# Patient Record
Sex: Male | Born: 1940 | Race: White | Hispanic: Yes | Smoking: Former smoker
Health system: Southern US, Community
[De-identification: ages and names within clinical notes are randomized; demographics above are authoritative.]

## PROBLEM LIST (undated history)

## (undated) DIAGNOSIS — E785 Hyperlipidemia, unspecified: Secondary | ICD-10-CM

## (undated) DIAGNOSIS — K2960 Other gastritis without bleeding: Secondary | ICD-10-CM

## (undated) DIAGNOSIS — E1122 Type 2 diabetes mellitus with diabetic chronic kidney disease: Secondary | ICD-10-CM

## (undated) DIAGNOSIS — E663 Overweight: Secondary | ICD-10-CM

## (undated) DIAGNOSIS — N183 Chronic kidney disease, stage 3 unspecified: Secondary | ICD-10-CM

## (undated) DIAGNOSIS — R0609 Other forms of dyspnea: Secondary | ICD-10-CM

## (undated) DIAGNOSIS — Z6827 Body mass index (BMI) 27.0-27.9, adult: Secondary | ICD-10-CM

## (undated) DIAGNOSIS — F3340 Major depressive disorder, recurrent, in remission, unspecified: Secondary | ICD-10-CM

## (undated) DIAGNOSIS — I110 Hypertensive heart disease with heart failure: Secondary | ICD-10-CM

## (undated) DIAGNOSIS — Z6720 Type B blood, Rh positive: Secondary | ICD-10-CM

## (undated) DIAGNOSIS — N1832 Chronic kidney disease, stage 3b: Secondary | ICD-10-CM

## (undated) DIAGNOSIS — M8580 Other specified disorders of bone density and structure, unspecified site: Secondary | ICD-10-CM

## (undated) DIAGNOSIS — E559 Vitamin D deficiency, unspecified: Secondary | ICD-10-CM

## (undated) DIAGNOSIS — I25118 Atherosclerotic heart disease of native coronary artery with other forms of angina pectoris: Secondary | ICD-10-CM

## (undated) DIAGNOSIS — E039 Hypothyroidism, unspecified: Secondary | ICD-10-CM

## (undated) HISTORY — DX: Other specified disorders of bone density and structure, unspecified site: M85.80

## (undated) HISTORY — DX: Atherosclerotic heart disease of native coronary artery with other forms of angina pectoris: I25.118

## (undated) HISTORY — DX: Overweight: E66.3

## (undated) HISTORY — DX: Hyperlipidemia, unspecified: E78.5

## (undated) HISTORY — DX: Body mass index (BMI) 27.0-27.9, adult: Z68.27

## (undated) HISTORY — DX: Chronic kidney disease, stage 3b: N18.32

## (undated) HISTORY — DX: Type 2 diabetes mellitus with diabetic chronic kidney disease: E11.22

## (undated) HISTORY — DX: Other forms of dyspnea: R06.09

## (undated) HISTORY — DX: Type B blood, Rh positive: Z67.20

## (undated) HISTORY — PX: TONSILLECTOMY: SUR1361

## (undated) HISTORY — DX: Vitamin D deficiency, unspecified: E55.9

## (undated) HISTORY — DX: Hypothyroidism, unspecified: E03.9

## (undated) HISTORY — DX: Hypertensive heart disease with heart failure: I11.0

## (undated) HISTORY — DX: Major depressive disorder, recurrent, in remission, unspecified: F33.40

## (undated) HISTORY — DX: Other gastritis without bleeding: K29.60

## (undated) HISTORY — DX: Chronic kidney disease, stage 3 unspecified: N18.30

---

## 1993-10-06 HISTORY — PX: CORONARY ARTERY BYPASS GRAFT: SHX141

## 2012-10-06 HISTORY — PX: CHOLECYSTECTOMY: SHX55

## 2016-01-05 HISTORY — PX: CATARACT EXTRACTION, BILATERAL: SHX1313

## 2017-11-10 ENCOUNTER — Other Ambulatory Visit (HOSPITAL_COMMUNITY): Payer: Self-pay | Admitting: Endocrinology

## 2017-11-10 DIAGNOSIS — E0590 Thyrotoxicosis, unspecified without thyrotoxic crisis or storm: Secondary | ICD-10-CM

## 2017-11-19 ENCOUNTER — Encounter (HOSPITAL_COMMUNITY)
Admission: RE | Admit: 2017-11-19 | Discharge: 2017-11-19 | Disposition: A | Discharge status: Home / Self Care | Payer: Medicare Other | Source: Ambulatory Visit | Attending: Endocrinology | Admitting: Endocrinology

## 2017-11-19 DIAGNOSIS — E0590 Thyrotoxicosis, unspecified without thyrotoxic crisis or storm: Secondary | ICD-10-CM | POA: Diagnosis present

## 2017-11-20 ENCOUNTER — Encounter (HOSPITAL_COMMUNITY)
Admission: RE | Admit: 2017-11-20 | Discharge: 2017-11-20 | Disposition: A | Discharge status: Home / Self Care | Payer: Medicare Other | Source: Ambulatory Visit | Attending: Endocrinology | Admitting: Endocrinology

## 2017-11-20 MED ORDER — SODIUM PERTECHNETATE TC 99M INJECTION
10.0000 | Freq: Once | INTRAVENOUS | Status: AC | PRN
  Administered 2017-11-20: 10 via INTRAVENOUS

## 2017-12-10 ENCOUNTER — Other Ambulatory Visit (HOSPITAL_COMMUNITY): Payer: Self-pay | Admitting: Endocrinology

## 2017-12-10 DIAGNOSIS — E0590 Thyrotoxicosis, unspecified without thyrotoxic crisis or storm: Secondary | ICD-10-CM

## 2017-12-17 ENCOUNTER — Encounter (HOSPITAL_COMMUNITY)
Admission: RE | Admit: 2017-12-17 | Discharge: 2017-12-17 | Disposition: A | Discharge status: Home / Self Care | Payer: Medicare Other | Source: Ambulatory Visit | Attending: Endocrinology | Admitting: Endocrinology

## 2017-12-17 DIAGNOSIS — E0590 Thyrotoxicosis, unspecified without thyrotoxic crisis or storm: Secondary | ICD-10-CM | POA: Diagnosis present

## 2017-12-17 MED ORDER — SODIUM IODIDE I 131 CAPSULE
21.5000 | Freq: Once | INTRAVENOUS | Status: AC | PRN
  Administered 2017-12-17: 21.5 via ORAL

## 2019-10-10 DIAGNOSIS — I110 Hypertensive heart disease with heart failure: Secondary | ICD-10-CM | POA: Diagnosis not present

## 2019-10-10 DIAGNOSIS — N183 Chronic kidney disease, stage 3 unspecified: Secondary | ICD-10-CM | POA: Diagnosis not present

## 2019-10-10 DIAGNOSIS — I2510 Atherosclerotic heart disease of native coronary artery without angina pectoris: Secondary | ICD-10-CM | POA: Diagnosis not present

## 2019-10-10 DIAGNOSIS — E039 Hypothyroidism, unspecified: Secondary | ICD-10-CM | POA: Diagnosis not present

## 2019-10-10 DIAGNOSIS — Z0000 Encounter for general adult medical examination without abnormal findings: Secondary | ICD-10-CM | POA: Diagnosis not present

## 2019-10-10 DIAGNOSIS — K2100 Gastro-esophageal reflux disease with esophagitis, without bleeding: Secondary | ICD-10-CM | POA: Diagnosis not present

## 2019-10-10 DIAGNOSIS — E785 Hyperlipidemia, unspecified: Secondary | ICD-10-CM | POA: Diagnosis not present

## 2019-10-10 DIAGNOSIS — E1122 Type 2 diabetes mellitus with diabetic chronic kidney disease: Secondary | ICD-10-CM | POA: Diagnosis not present

## 2019-10-10 DIAGNOSIS — Z79899 Other long term (current) drug therapy: Secondary | ICD-10-CM | POA: Diagnosis not present

## 2019-10-13 DIAGNOSIS — N189 Chronic kidney disease, unspecified: Secondary | ICD-10-CM | POA: Diagnosis not present

## 2019-10-13 DIAGNOSIS — E109 Type 1 diabetes mellitus without complications: Secondary | ICD-10-CM | POA: Diagnosis not present

## 2019-10-13 DIAGNOSIS — I10 Essential (primary) hypertension: Secondary | ICD-10-CM | POA: Diagnosis not present

## 2019-10-13 DIAGNOSIS — E890 Postprocedural hypothyroidism: Secondary | ICD-10-CM | POA: Diagnosis not present

## 2019-10-13 DIAGNOSIS — E7800 Pure hypercholesterolemia, unspecified: Secondary | ICD-10-CM | POA: Diagnosis not present

## 2019-10-13 DIAGNOSIS — R809 Proteinuria, unspecified: Secondary | ICD-10-CM | POA: Diagnosis not present

## 2019-11-10 DIAGNOSIS — H47233 Glaucomatous optic atrophy, bilateral: Secondary | ICD-10-CM | POA: Diagnosis not present

## 2019-11-10 DIAGNOSIS — H40013 Open angle with borderline findings, low risk, bilateral: Secondary | ICD-10-CM | POA: Diagnosis not present

## 2019-11-10 DIAGNOSIS — I10 Essential (primary) hypertension: Secondary | ICD-10-CM | POA: Diagnosis not present

## 2020-01-23 DIAGNOSIS — L570 Actinic keratosis: Secondary | ICD-10-CM | POA: Diagnosis not present

## 2020-01-23 DIAGNOSIS — R233 Spontaneous ecchymoses: Secondary | ICD-10-CM | POA: Diagnosis not present

## 2020-01-23 DIAGNOSIS — L578 Other skin changes due to chronic exposure to nonionizing radiation: Secondary | ICD-10-CM | POA: Diagnosis not present

## 2020-01-23 DIAGNOSIS — C44319 Basal cell carcinoma of skin of other parts of face: Secondary | ICD-10-CM | POA: Diagnosis not present

## 2020-01-25 DIAGNOSIS — C44329 Squamous cell carcinoma of skin of other parts of face: Secondary | ICD-10-CM | POA: Diagnosis not present

## 2020-04-04 DIAGNOSIS — N1831 Chronic kidney disease, stage 3a: Secondary | ICD-10-CM | POA: Diagnosis not present

## 2020-04-04 DIAGNOSIS — E785 Hyperlipidemia, unspecified: Secondary | ICD-10-CM | POA: Diagnosis not present

## 2020-04-04 DIAGNOSIS — N183 Chronic kidney disease, stage 3 unspecified: Secondary | ICD-10-CM | POA: Diagnosis not present

## 2020-04-04 DIAGNOSIS — Z0183 Encounter for blood typing: Secondary | ICD-10-CM | POA: Diagnosis not present

## 2020-04-04 DIAGNOSIS — E039 Hypothyroidism, unspecified: Secondary | ICD-10-CM | POA: Diagnosis not present

## 2020-04-04 DIAGNOSIS — F3340 Major depressive disorder, recurrent, in remission, unspecified: Secondary | ICD-10-CM | POA: Diagnosis not present

## 2020-04-04 DIAGNOSIS — E1122 Type 2 diabetes mellitus with diabetic chronic kidney disease: Secondary | ICD-10-CM | POA: Diagnosis not present

## 2020-04-04 DIAGNOSIS — Z79899 Other long term (current) drug therapy: Secondary | ICD-10-CM | POA: Diagnosis not present

## 2020-04-04 DIAGNOSIS — I110 Hypertensive heart disease with heart failure: Secondary | ICD-10-CM | POA: Diagnosis not present

## 2020-04-04 DIAGNOSIS — I2510 Atherosclerotic heart disease of native coronary artery without angina pectoris: Secondary | ICD-10-CM | POA: Diagnosis not present

## 2020-04-11 DIAGNOSIS — N189 Chronic kidney disease, unspecified: Secondary | ICD-10-CM | POA: Diagnosis not present

## 2020-04-11 DIAGNOSIS — E890 Postprocedural hypothyroidism: Secondary | ICD-10-CM | POA: Diagnosis not present

## 2020-04-11 DIAGNOSIS — R809 Proteinuria, unspecified: Secondary | ICD-10-CM | POA: Diagnosis not present

## 2020-04-11 DIAGNOSIS — E109 Type 1 diabetes mellitus without complications: Secondary | ICD-10-CM | POA: Diagnosis not present

## 2020-04-11 DIAGNOSIS — I10 Essential (primary) hypertension: Secondary | ICD-10-CM | POA: Diagnosis not present

## 2020-04-11 DIAGNOSIS — E7800 Pure hypercholesterolemia, unspecified: Secondary | ICD-10-CM | POA: Diagnosis not present

## 2020-05-09 DIAGNOSIS — H52223 Regular astigmatism, bilateral: Secondary | ICD-10-CM | POA: Diagnosis not present

## 2020-05-09 DIAGNOSIS — H524 Presbyopia: Secondary | ICD-10-CM | POA: Diagnosis not present

## 2020-05-09 DIAGNOSIS — I10 Essential (primary) hypertension: Secondary | ICD-10-CM | POA: Diagnosis not present

## 2020-05-09 DIAGNOSIS — H26492 Other secondary cataract, left eye: Secondary | ICD-10-CM | POA: Diagnosis not present

## 2020-05-09 DIAGNOSIS — H40013 Open angle with borderline findings, low risk, bilateral: Secondary | ICD-10-CM | POA: Diagnosis not present

## 2020-05-09 DIAGNOSIS — E109 Type 1 diabetes mellitus without complications: Secondary | ICD-10-CM | POA: Diagnosis not present

## 2020-05-09 DIAGNOSIS — H47233 Glaucomatous optic atrophy, bilateral: Secondary | ICD-10-CM | POA: Diagnosis not present

## 2020-05-09 DIAGNOSIS — Z961 Presence of intraocular lens: Secondary | ICD-10-CM | POA: Diagnosis not present

## 2020-05-09 DIAGNOSIS — Z9841 Cataract extraction status, right eye: Secondary | ICD-10-CM | POA: Diagnosis not present

## 2020-07-19 DIAGNOSIS — Z23 Encounter for immunization: Secondary | ICD-10-CM | POA: Diagnosis not present

## 2020-10-17 DIAGNOSIS — I25118 Atherosclerotic heart disease of native coronary artery with other forms of angina pectoris: Secondary | ICD-10-CM | POA: Diagnosis not present

## 2020-10-17 DIAGNOSIS — Z0000 Encounter for general adult medical examination without abnormal findings: Secondary | ICD-10-CM | POA: Diagnosis not present

## 2020-10-17 DIAGNOSIS — N183 Chronic kidney disease, stage 3 unspecified: Secondary | ICD-10-CM | POA: Diagnosis not present

## 2020-10-17 DIAGNOSIS — I110 Hypertensive heart disease with heart failure: Secondary | ICD-10-CM | POA: Diagnosis not present

## 2020-10-17 DIAGNOSIS — E039 Hypothyroidism, unspecified: Secondary | ICD-10-CM | POA: Diagnosis not present

## 2020-10-17 DIAGNOSIS — N1832 Chronic kidney disease, stage 3b: Secondary | ICD-10-CM | POA: Diagnosis not present

## 2020-10-17 DIAGNOSIS — Z125 Encounter for screening for malignant neoplasm of prostate: Secondary | ICD-10-CM | POA: Diagnosis not present

## 2020-10-17 DIAGNOSIS — E785 Hyperlipidemia, unspecified: Secondary | ICD-10-CM | POA: Diagnosis not present

## 2020-10-17 DIAGNOSIS — E1122 Type 2 diabetes mellitus with diabetic chronic kidney disease: Secondary | ICD-10-CM | POA: Diagnosis not present

## 2020-10-29 DIAGNOSIS — E7800 Pure hypercholesterolemia, unspecified: Secondary | ICD-10-CM | POA: Diagnosis not present

## 2020-10-29 DIAGNOSIS — E890 Postprocedural hypothyroidism: Secondary | ICD-10-CM | POA: Diagnosis not present

## 2020-10-29 DIAGNOSIS — R809 Proteinuria, unspecified: Secondary | ICD-10-CM | POA: Diagnosis not present

## 2020-10-29 DIAGNOSIS — N189 Chronic kidney disease, unspecified: Secondary | ICD-10-CM | POA: Diagnosis not present

## 2020-10-29 DIAGNOSIS — I10 Essential (primary) hypertension: Secondary | ICD-10-CM | POA: Diagnosis not present

## 2020-10-29 DIAGNOSIS — E109 Type 1 diabetes mellitus without complications: Secondary | ICD-10-CM | POA: Diagnosis not present

## 2020-11-23 DIAGNOSIS — H40013 Open angle with borderline findings, low risk, bilateral: Secondary | ICD-10-CM | POA: Diagnosis not present

## 2020-11-23 DIAGNOSIS — H47233 Glaucomatous optic atrophy, bilateral: Secondary | ICD-10-CM | POA: Diagnosis not present

## 2020-12-04 DIAGNOSIS — L570 Actinic keratosis: Secondary | ICD-10-CM | POA: Diagnosis not present

## 2020-12-04 DIAGNOSIS — C44319 Basal cell carcinoma of skin of other parts of face: Secondary | ICD-10-CM | POA: Diagnosis not present

## 2020-12-04 DIAGNOSIS — L814 Other melanin hyperpigmentation: Secondary | ICD-10-CM | POA: Diagnosis not present

## 2020-12-04 DIAGNOSIS — L821 Other seborrheic keratosis: Secondary | ICD-10-CM | POA: Diagnosis not present

## 2020-12-06 DIAGNOSIS — E1022 Type 1 diabetes mellitus with diabetic chronic kidney disease: Secondary | ICD-10-CM | POA: Diagnosis not present

## 2020-12-06 DIAGNOSIS — I129 Hypertensive chronic kidney disease with stage 1 through stage 4 chronic kidney disease, or unspecified chronic kidney disease: Secondary | ICD-10-CM | POA: Diagnosis not present

## 2020-12-06 DIAGNOSIS — N1832 Chronic kidney disease, stage 3b: Secondary | ICD-10-CM | POA: Diagnosis not present

## 2020-12-06 DIAGNOSIS — E785 Hyperlipidemia, unspecified: Secondary | ICD-10-CM | POA: Diagnosis not present

## 2020-12-06 DIAGNOSIS — N189 Chronic kidney disease, unspecified: Secondary | ICD-10-CM | POA: Diagnosis not present

## 2020-12-06 DIAGNOSIS — N2581 Secondary hyperparathyroidism of renal origin: Secondary | ICD-10-CM | POA: Diagnosis not present

## 2020-12-06 DIAGNOSIS — D631 Anemia in chronic kidney disease: Secondary | ICD-10-CM | POA: Diagnosis not present

## 2020-12-11 DIAGNOSIS — C44319 Basal cell carcinoma of skin of other parts of face: Secondary | ICD-10-CM | POA: Diagnosis not present

## 2020-12-17 ENCOUNTER — Other Ambulatory Visit: Payer: Self-pay | Admitting: Nephrology

## 2020-12-17 DIAGNOSIS — N1832 Chronic kidney disease, stage 3b: Secondary | ICD-10-CM

## 2020-12-20 DIAGNOSIS — N400 Enlarged prostate without lower urinary tract symptoms: Secondary | ICD-10-CM | POA: Diagnosis not present

## 2020-12-20 DIAGNOSIS — N1832 Chronic kidney disease, stage 3b: Secondary | ICD-10-CM | POA: Diagnosis not present

## 2020-12-20 DIAGNOSIS — N189 Chronic kidney disease, unspecified: Secondary | ICD-10-CM | POA: Diagnosis not present

## 2020-12-24 ENCOUNTER — Other Ambulatory Visit: Payer: Medicare Other

## 2020-12-24 DIAGNOSIS — I129 Hypertensive chronic kidney disease with stage 1 through stage 4 chronic kidney disease, or unspecified chronic kidney disease: Secondary | ICD-10-CM | POA: Diagnosis not present

## 2021-04-10 DIAGNOSIS — E890 Postprocedural hypothyroidism: Secondary | ICD-10-CM | POA: Diagnosis not present

## 2021-04-10 DIAGNOSIS — I10 Essential (primary) hypertension: Secondary | ICD-10-CM | POA: Diagnosis not present

## 2021-04-10 DIAGNOSIS — R809 Proteinuria, unspecified: Secondary | ICD-10-CM | POA: Diagnosis not present

## 2021-04-10 DIAGNOSIS — E7800 Pure hypercholesterolemia, unspecified: Secondary | ICD-10-CM | POA: Diagnosis not present

## 2021-04-10 DIAGNOSIS — E109 Type 1 diabetes mellitus without complications: Secondary | ICD-10-CM | POA: Diagnosis not present

## 2021-04-10 DIAGNOSIS — N189 Chronic kidney disease, unspecified: Secondary | ICD-10-CM | POA: Diagnosis not present

## 2021-04-16 DIAGNOSIS — E785 Hyperlipidemia, unspecified: Secondary | ICD-10-CM | POA: Diagnosis not present

## 2021-04-16 DIAGNOSIS — I25118 Atherosclerotic heart disease of native coronary artery with other forms of angina pectoris: Secondary | ICD-10-CM | POA: Diagnosis not present

## 2021-04-16 DIAGNOSIS — N1832 Chronic kidney disease, stage 3b: Secondary | ICD-10-CM | POA: Diagnosis not present

## 2021-04-16 DIAGNOSIS — I110 Hypertensive heart disease with heart failure: Secondary | ICD-10-CM | POA: Diagnosis not present

## 2021-04-16 DIAGNOSIS — E559 Vitamin D deficiency, unspecified: Secondary | ICD-10-CM | POA: Diagnosis not present

## 2021-04-16 DIAGNOSIS — E039 Hypothyroidism, unspecified: Secondary | ICD-10-CM | POA: Diagnosis not present

## 2021-04-16 DIAGNOSIS — Z79899 Other long term (current) drug therapy: Secondary | ICD-10-CM | POA: Diagnosis not present

## 2021-04-16 DIAGNOSIS — E1122 Type 2 diabetes mellitus with diabetic chronic kidney disease: Secondary | ICD-10-CM | POA: Diagnosis not present

## 2021-04-16 DIAGNOSIS — N183 Chronic kidney disease, stage 3 unspecified: Secondary | ICD-10-CM | POA: Diagnosis not present

## 2021-04-23 DIAGNOSIS — Z23 Encounter for immunization: Secondary | ICD-10-CM | POA: Diagnosis not present

## 2021-05-13 DIAGNOSIS — H47233 Glaucomatous optic atrophy, bilateral: Secondary | ICD-10-CM | POA: Diagnosis not present

## 2021-05-13 DIAGNOSIS — H40003 Preglaucoma, unspecified, bilateral: Secondary | ICD-10-CM | POA: Diagnosis not present

## 2021-05-13 DIAGNOSIS — H5212 Myopia, left eye: Secondary | ICD-10-CM | POA: Diagnosis not present

## 2021-05-13 DIAGNOSIS — H26492 Other secondary cataract, left eye: Secondary | ICD-10-CM | POA: Diagnosis not present

## 2021-05-13 DIAGNOSIS — E119 Type 2 diabetes mellitus without complications: Secondary | ICD-10-CM | POA: Diagnosis not present

## 2021-05-13 DIAGNOSIS — Z961 Presence of intraocular lens: Secondary | ICD-10-CM | POA: Diagnosis not present

## 2021-05-13 DIAGNOSIS — Z794 Long term (current) use of insulin: Secondary | ICD-10-CM | POA: Diagnosis not present

## 2021-05-13 DIAGNOSIS — H40013 Open angle with borderline findings, low risk, bilateral: Secondary | ICD-10-CM | POA: Diagnosis not present

## 2021-05-13 DIAGNOSIS — Z9841 Cataract extraction status, right eye: Secondary | ICD-10-CM | POA: Diagnosis not present

## 2021-06-13 DIAGNOSIS — N2581 Secondary hyperparathyroidism of renal origin: Secondary | ICD-10-CM | POA: Diagnosis not present

## 2021-06-13 DIAGNOSIS — D631 Anemia in chronic kidney disease: Secondary | ICD-10-CM | POA: Diagnosis not present

## 2021-06-13 DIAGNOSIS — I129 Hypertensive chronic kidney disease with stage 1 through stage 4 chronic kidney disease, or unspecified chronic kidney disease: Secondary | ICD-10-CM | POA: Diagnosis not present

## 2021-06-13 DIAGNOSIS — E1022 Type 1 diabetes mellitus with diabetic chronic kidney disease: Secondary | ICD-10-CM | POA: Diagnosis not present

## 2021-06-13 DIAGNOSIS — N1832 Chronic kidney disease, stage 3b: Secondary | ICD-10-CM | POA: Diagnosis not present

## 2021-06-13 DIAGNOSIS — Z23 Encounter for immunization: Secondary | ICD-10-CM | POA: Diagnosis not present

## 2021-10-10 DIAGNOSIS — E890 Postprocedural hypothyroidism: Secondary | ICD-10-CM | POA: Diagnosis not present

## 2021-10-10 DIAGNOSIS — I10 Essential (primary) hypertension: Secondary | ICD-10-CM | POA: Diagnosis not present

## 2021-10-10 DIAGNOSIS — N189 Chronic kidney disease, unspecified: Secondary | ICD-10-CM | POA: Diagnosis not present

## 2021-10-10 DIAGNOSIS — R809 Proteinuria, unspecified: Secondary | ICD-10-CM | POA: Diagnosis not present

## 2021-10-10 DIAGNOSIS — E7800 Pure hypercholesterolemia, unspecified: Secondary | ICD-10-CM | POA: Diagnosis not present

## 2021-10-10 DIAGNOSIS — E109 Type 1 diabetes mellitus without complications: Secondary | ICD-10-CM | POA: Diagnosis not present

## 2021-10-22 DIAGNOSIS — M8580 Other specified disorders of bone density and structure, unspecified site: Secondary | ICD-10-CM | POA: Diagnosis not present

## 2021-10-22 DIAGNOSIS — Z79899 Other long term (current) drug therapy: Secondary | ICD-10-CM | POA: Diagnosis not present

## 2021-10-22 DIAGNOSIS — I25118 Atherosclerotic heart disease of native coronary artery with other forms of angina pectoris: Secondary | ICD-10-CM | POA: Diagnosis not present

## 2021-10-22 DIAGNOSIS — E559 Vitamin D deficiency, unspecified: Secondary | ICD-10-CM | POA: Diagnosis not present

## 2021-10-22 DIAGNOSIS — E1122 Type 2 diabetes mellitus with diabetic chronic kidney disease: Secondary | ICD-10-CM | POA: Diagnosis not present

## 2021-10-22 DIAGNOSIS — E785 Hyperlipidemia, unspecified: Secondary | ICD-10-CM | POA: Diagnosis not present

## 2021-10-22 DIAGNOSIS — N183 Chronic kidney disease, stage 3 unspecified: Secondary | ICD-10-CM | POA: Diagnosis not present

## 2021-10-22 DIAGNOSIS — E039 Hypothyroidism, unspecified: Secondary | ICD-10-CM | POA: Diagnosis not present

## 2021-10-22 DIAGNOSIS — N1832 Chronic kidney disease, stage 3b: Secondary | ICD-10-CM | POA: Diagnosis not present

## 2021-10-25 DIAGNOSIS — Z23 Encounter for immunization: Secondary | ICD-10-CM | POA: Diagnosis not present

## 2021-11-14 DIAGNOSIS — H47233 Glaucomatous optic atrophy, bilateral: Secondary | ICD-10-CM | POA: Diagnosis not present

## 2021-11-14 DIAGNOSIS — H40013 Open angle with borderline findings, low risk, bilateral: Secondary | ICD-10-CM | POA: Diagnosis not present

## 2021-12-10 DIAGNOSIS — L578 Other skin changes due to chronic exposure to nonionizing radiation: Secondary | ICD-10-CM | POA: Diagnosis not present

## 2021-12-10 DIAGNOSIS — C44329 Squamous cell carcinoma of skin of other parts of face: Secondary | ICD-10-CM | POA: Diagnosis not present

## 2021-12-10 DIAGNOSIS — L821 Other seborrheic keratosis: Secondary | ICD-10-CM | POA: Diagnosis not present

## 2021-12-10 DIAGNOSIS — C44319 Basal cell carcinoma of skin of other parts of face: Secondary | ICD-10-CM | POA: Diagnosis not present

## 2021-12-10 DIAGNOSIS — L570 Actinic keratosis: Secondary | ICD-10-CM | POA: Diagnosis not present

## 2021-12-12 DIAGNOSIS — N1832 Chronic kidney disease, stage 3b: Secondary | ICD-10-CM | POA: Diagnosis not present

## 2021-12-12 DIAGNOSIS — D631 Anemia in chronic kidney disease: Secondary | ICD-10-CM | POA: Diagnosis not present

## 2021-12-12 DIAGNOSIS — E785 Hyperlipidemia, unspecified: Secondary | ICD-10-CM | POA: Diagnosis not present

## 2021-12-12 DIAGNOSIS — I129 Hypertensive chronic kidney disease with stage 1 through stage 4 chronic kidney disease, or unspecified chronic kidney disease: Secondary | ICD-10-CM | POA: Diagnosis not present

## 2021-12-12 DIAGNOSIS — E1022 Type 1 diabetes mellitus with diabetic chronic kidney disease: Secondary | ICD-10-CM | POA: Diagnosis not present

## 2021-12-12 DIAGNOSIS — E039 Hypothyroidism, unspecified: Secondary | ICD-10-CM | POA: Diagnosis not present

## 2022-04-14 DIAGNOSIS — E890 Postprocedural hypothyroidism: Secondary | ICD-10-CM | POA: Diagnosis not present

## 2022-04-14 DIAGNOSIS — R809 Proteinuria, unspecified: Secondary | ICD-10-CM | POA: Diagnosis not present

## 2022-04-14 DIAGNOSIS — E7800 Pure hypercholesterolemia, unspecified: Secondary | ICD-10-CM | POA: Diagnosis not present

## 2022-04-14 DIAGNOSIS — I10 Essential (primary) hypertension: Secondary | ICD-10-CM | POA: Diagnosis not present

## 2022-04-14 DIAGNOSIS — E109 Type 1 diabetes mellitus without complications: Secondary | ICD-10-CM | POA: Diagnosis not present

## 2022-04-14 DIAGNOSIS — N189 Chronic kidney disease, unspecified: Secondary | ICD-10-CM | POA: Diagnosis not present

## 2022-04-21 DIAGNOSIS — I25118 Atherosclerotic heart disease of native coronary artery with other forms of angina pectoris: Secondary | ICD-10-CM | POA: Diagnosis not present

## 2022-04-21 DIAGNOSIS — N183 Chronic kidney disease, stage 3 unspecified: Secondary | ICD-10-CM | POA: Diagnosis not present

## 2022-04-21 DIAGNOSIS — I110 Hypertensive heart disease with heart failure: Secondary | ICD-10-CM | POA: Diagnosis not present

## 2022-04-21 DIAGNOSIS — F3340 Major depressive disorder, recurrent, in remission, unspecified: Secondary | ICD-10-CM | POA: Diagnosis not present

## 2022-04-21 DIAGNOSIS — E039 Hypothyroidism, unspecified: Secondary | ICD-10-CM | POA: Diagnosis not present

## 2022-04-21 DIAGNOSIS — E785 Hyperlipidemia, unspecified: Secondary | ICD-10-CM | POA: Diagnosis not present

## 2022-04-21 DIAGNOSIS — E1122 Type 2 diabetes mellitus with diabetic chronic kidney disease: Secondary | ICD-10-CM | POA: Diagnosis not present

## 2022-04-21 DIAGNOSIS — N1832 Chronic kidney disease, stage 3b: Secondary | ICD-10-CM | POA: Diagnosis not present

## 2022-04-21 DIAGNOSIS — E559 Vitamin D deficiency, unspecified: Secondary | ICD-10-CM | POA: Diagnosis not present

## 2022-05-14 DIAGNOSIS — E119 Type 2 diabetes mellitus without complications: Secondary | ICD-10-CM | POA: Diagnosis not present

## 2022-05-14 DIAGNOSIS — H40003 Preglaucoma, unspecified, bilateral: Secondary | ICD-10-CM | POA: Diagnosis not present

## 2022-05-14 DIAGNOSIS — H40013 Open angle with borderline findings, low risk, bilateral: Secondary | ICD-10-CM | POA: Diagnosis not present

## 2022-05-14 DIAGNOSIS — Z9849 Cataract extraction status, unspecified eye: Secondary | ICD-10-CM | POA: Diagnosis not present

## 2022-05-14 DIAGNOSIS — Z961 Presence of intraocular lens: Secondary | ICD-10-CM | POA: Diagnosis not present

## 2022-05-14 DIAGNOSIS — Z794 Long term (current) use of insulin: Secondary | ICD-10-CM | POA: Diagnosis not present

## 2022-05-14 DIAGNOSIS — H47233 Glaucomatous optic atrophy, bilateral: Secondary | ICD-10-CM | POA: Diagnosis not present

## 2022-05-14 DIAGNOSIS — H52223 Regular astigmatism, bilateral: Secondary | ICD-10-CM | POA: Diagnosis not present

## 2022-05-14 DIAGNOSIS — H5212 Myopia, left eye: Secondary | ICD-10-CM | POA: Diagnosis not present

## 2022-06-17 DIAGNOSIS — C44319 Basal cell carcinoma of skin of other parts of face: Secondary | ICD-10-CM | POA: Diagnosis not present

## 2022-06-17 DIAGNOSIS — L821 Other seborrheic keratosis: Secondary | ICD-10-CM | POA: Diagnosis not present

## 2022-06-17 DIAGNOSIS — L570 Actinic keratosis: Secondary | ICD-10-CM | POA: Diagnosis not present

## 2022-07-09 DIAGNOSIS — Z23 Encounter for immunization: Secondary | ICD-10-CM | POA: Diagnosis not present

## 2022-07-09 DIAGNOSIS — N1832 Chronic kidney disease, stage 3b: Secondary | ICD-10-CM | POA: Diagnosis not present

## 2022-07-09 DIAGNOSIS — I129 Hypertensive chronic kidney disease with stage 1 through stage 4 chronic kidney disease, or unspecified chronic kidney disease: Secondary | ICD-10-CM | POA: Diagnosis not present

## 2022-07-09 DIAGNOSIS — D631 Anemia in chronic kidney disease: Secondary | ICD-10-CM | POA: Diagnosis not present

## 2022-07-09 DIAGNOSIS — E1022 Type 1 diabetes mellitus with diabetic chronic kidney disease: Secondary | ICD-10-CM | POA: Diagnosis not present

## 2022-08-20 DIAGNOSIS — I25118 Atherosclerotic heart disease of native coronary artery with other forms of angina pectoris: Secondary | ICD-10-CM | POA: Diagnosis not present

## 2022-08-20 DIAGNOSIS — R0609 Other forms of dyspnea: Secondary | ICD-10-CM | POA: Diagnosis not present

## 2022-08-20 DIAGNOSIS — E663 Overweight: Secondary | ICD-10-CM | POA: Diagnosis not present

## 2022-08-20 DIAGNOSIS — I110 Hypertensive heart disease with heart failure: Secondary | ICD-10-CM | POA: Diagnosis not present

## 2022-08-20 DIAGNOSIS — N183 Chronic kidney disease, stage 3 unspecified: Secondary | ICD-10-CM | POA: Diagnosis not present

## 2022-08-20 DIAGNOSIS — Z6827 Body mass index (BMI) 27.0-27.9, adult: Secondary | ICD-10-CM | POA: Diagnosis not present

## 2022-08-20 DIAGNOSIS — E1122 Type 2 diabetes mellitus with diabetic chronic kidney disease: Secondary | ICD-10-CM | POA: Diagnosis not present

## 2022-08-20 DIAGNOSIS — N1832 Chronic kidney disease, stage 3b: Secondary | ICD-10-CM | POA: Diagnosis not present

## 2022-08-26 ENCOUNTER — Other Ambulatory Visit: Payer: Self-pay

## 2022-08-26 DIAGNOSIS — M8580 Other specified disorders of bone density and structure, unspecified site: Secondary | ICD-10-CM | POA: Insufficient documentation

## 2022-08-26 DIAGNOSIS — K2960 Other gastritis without bleeding: Secondary | ICD-10-CM | POA: Insufficient documentation

## 2022-08-26 DIAGNOSIS — E663 Overweight: Secondary | ICD-10-CM | POA: Insufficient documentation

## 2022-08-26 DIAGNOSIS — E1122 Type 2 diabetes mellitus with diabetic chronic kidney disease: Secondary | ICD-10-CM | POA: Insufficient documentation

## 2022-08-26 DIAGNOSIS — I25118 Atherosclerotic heart disease of native coronary artery with other forms of angina pectoris: Secondary | ICD-10-CM | POA: Insufficient documentation

## 2022-08-26 DIAGNOSIS — Z6720 Type B blood, Rh positive: Secondary | ICD-10-CM | POA: Insufficient documentation

## 2022-08-26 DIAGNOSIS — N1832 Chronic kidney disease, stage 3b: Secondary | ICD-10-CM | POA: Insufficient documentation

## 2022-08-26 DIAGNOSIS — R0609 Other forms of dyspnea: Secondary | ICD-10-CM | POA: Insufficient documentation

## 2022-08-26 DIAGNOSIS — Z6827 Body mass index (BMI) 27.0-27.9, adult: Secondary | ICD-10-CM | POA: Insufficient documentation

## 2022-08-26 DIAGNOSIS — F3340 Major depressive disorder, recurrent, in remission, unspecified: Secondary | ICD-10-CM | POA: Insufficient documentation

## 2022-08-26 DIAGNOSIS — I2510 Atherosclerotic heart disease of native coronary artery without angina pectoris: Secondary | ICD-10-CM | POA: Insufficient documentation

## 2022-08-26 DIAGNOSIS — E559 Vitamin D deficiency, unspecified: Secondary | ICD-10-CM | POA: Insufficient documentation

## 2022-08-26 DIAGNOSIS — I110 Hypertensive heart disease with heart failure: Secondary | ICD-10-CM | POA: Insufficient documentation

## 2022-08-26 DIAGNOSIS — E039 Hypothyroidism, unspecified: Secondary | ICD-10-CM | POA: Insufficient documentation

## 2022-08-26 DIAGNOSIS — E785 Hyperlipidemia, unspecified: Secondary | ICD-10-CM | POA: Insufficient documentation

## 2022-09-04 ENCOUNTER — Ambulatory Visit: Payer: Medicare PPO | Attending: Cardiology | Admitting: Cardiology

## 2022-09-04 ENCOUNTER — Encounter: Payer: Self-pay | Admitting: Cardiology

## 2022-09-04 ENCOUNTER — Ambulatory Visit: Payer: Medicare PPO

## 2022-09-04 VITALS — BP 160/78 | HR 72 | Ht 69.0 in | Wt 191.6 lb

## 2022-09-04 DIAGNOSIS — E1122 Type 2 diabetes mellitus with diabetic chronic kidney disease: Secondary | ICD-10-CM

## 2022-09-04 DIAGNOSIS — E785 Hyperlipidemia, unspecified: Secondary | ICD-10-CM | POA: Diagnosis not present

## 2022-09-04 DIAGNOSIS — I10 Essential (primary) hypertension: Secondary | ICD-10-CM | POA: Diagnosis not present

## 2022-09-04 DIAGNOSIS — I2119 ST elevation (STEMI) myocardial infarction involving other coronary artery of inferior wall: Secondary | ICD-10-CM | POA: Diagnosis not present

## 2022-09-04 DIAGNOSIS — N183 Chronic kidney disease, stage 3 unspecified: Secondary | ICD-10-CM | POA: Diagnosis not present

## 2022-09-04 DIAGNOSIS — R0609 Other forms of dyspnea: Secondary | ICD-10-CM | POA: Diagnosis not present

## 2022-09-04 DIAGNOSIS — I259 Chronic ischemic heart disease, unspecified: Secondary | ICD-10-CM | POA: Diagnosis not present

## 2022-09-04 DIAGNOSIS — G249 Dystonia, unspecified: Secondary | ICD-10-CM | POA: Diagnosis not present

## 2022-09-04 DIAGNOSIS — Z951 Presence of aortocoronary bypass graft: Secondary | ICD-10-CM

## 2022-09-04 DIAGNOSIS — R079 Chest pain, unspecified: Secondary | ICD-10-CM | POA: Diagnosis not present

## 2022-09-04 HISTORY — DX: Presence of aortocoronary bypass graft: Z95.1

## 2022-09-04 HISTORY — DX: Essential (primary) hypertension: I10

## 2022-09-04 LAB — ECHOCARDIOGRAM COMPLETE
Height: 69 in
Weight: 3065.6 oz

## 2022-09-04 MED ORDER — ATORVASTATIN CALCIUM 80 MG PO TABS: 80.0000 mg | ORAL_TABLET | Freq: Every day | ORAL | 3 refills | Status: DC

## 2022-09-04 MED ORDER — ISOSORBIDE MONONITRATE ER 30 MG PO TB24: 30.0000 mg | ORAL_TABLET | Freq: Every day | ORAL | 3 refills | Status: DC

## 2022-09-04 MED ORDER — NITROGLYCERIN 0.4 MG SL SUBL: 0.4000 mg | SUBLINGUAL_TABLET | SUBLINGUAL | 1 refills | Status: DC | PRN

## 2022-09-04 MED ORDER — METOPROLOL TARTRATE 25 MG PO TABS: 25.0000 mg | ORAL_TABLET | Freq: Two times a day (BID) | ORAL | 3 refills | Status: DC

## 2022-09-04 MED ORDER — CLOPIDOGREL BISULFATE 75 MG PO TABS: 75.0000 mg | ORAL_TABLET | Freq: Every day | ORAL | 3 refills | Status: DC

## 2022-09-04 NOTE — Patient Instructions (Signed)
Medication Instructions:  Your physician has recommended you make the following change in your medication:   START: Plavix 75 mg daily START: Metoprolol tartrate 25 mg twice daily START: Imdur 30 mg daily START: Lipitor 80 mg daily START: Nitro 0.4 mg under the tongue every 5 minutes as needed for chest pain STOP: Zocor  *If you need a refill on your cardiac medications before your next appointment, please call your pharmacy*   Lab Work: None If you have labs (blood work) drawn today and your tests are completely normal, you will receive your results only by: MyChart Message (if you have MyChart) OR A paper copy in the mail If you have any lab test that is abnormal or we need to change your treatment, we will call you to review the results.   Testing/Procedures: Your physician has requested that you have an echocardiogram. Echocardiography is a painless test that uses sound waves to create images of your heart. It provides your doctor with information about the size and shape of your heart and how well your heart's chambers and valves are working. This procedure takes approximately one hour. There are no restrictions for this procedure. Please do NOT wear cologne, perfume, aftershave, or lotions (deodorant is allowed). Please arrive 15 minutes prior to your appointment time.    Follow-Up: At [facility], you and your health needs are our priority.  As part of our continuing mission to provide you with exceptional heart care, we have created designated Provider Care Teams.  These Care Teams include your primary Cardiologist (physician) and Advanced Practice Providers (APPs -  Physician Assistants and Nurse Practitioners) who all work together to provide you with the care you need, when you need it.  We recommend signing up for the patient portal called "MyChart".  Sign up information is provided on this After Visit Summary.  MyChart is used to connect with patients for Virtual  Visits (Telemedicine).  Patients are able to view lab/test results, encounter notes, upcoming appointments, etc.  Non-urgent messages can be sent to your provider as well.   To learn more about what you can do with MyChart, go to [email or web address].    Your next appointment:   2 week(s)  The format for your next appointment:   In Person  Provider:   Gypsy Balsam, MD    Other Instructions None  Important Information About Sugar

## 2022-09-04 NOTE — Progress Notes (Signed)
Cardiology Consultation:    Date:  09/04/2022   ID:  Joshua Morrison, DOB 08-30-1941, MRN [id number]  PCP:  Street, Joshua Coup, MD  Cardiologist:  Joshua Balsam, MD   Referring MD: Street, Joshua Morrison, *   Chief Complaint  Patient presents with   Results    Stress test    History of Present Illness:    Joshua Morrison is a 81 y.o. male who is being seen today for the evaluation of chest pain, abnormal stress test at the request of Street, Joshua Morrison, *.  Past medical history significant for coronary disease, in 1995 he did have coronary bypass graft which was done in [facility].  I have no details of the procedure.  Since that time there was no cardiac issues.  He did not have any myocardial infarction he did not have any congestive heart failure.  Additional problems include longstanding diabetes, essential hypertension, kidney dysfunction with creatinine neighborhood of 2 followed by nephrology.  For the last few weeks he started experiencing exertional chest pain.  Recently he ended up going to [place] to visit his friend and he had difficulty walking at the airport uphill because of initially tightness in the back going towards the neck and then to front of the chest.  That was associated with shortness of breath but no sweating.  He will wait few minutes sensation go away and that he could continue going downhill was not a problem with going up he will clearly create an issue of this is to the point that he started having difficulty doing activities of daily living and at times he does some effort he will get severely symptomatic.  He ended up having stress test done today in the hospital stress test is abnormal and I was asked to see him.  He stress test showed moderate infarction in inferior lateral wall with mild peri-infarct ischemia, also small infarction in the anterior septal wall.  Ejection fraction was 47%. He smoked but quit many years ago in 70s, he used to be very  active but since bandemia he was sitting at home and he jokingly saying that he became very good friend with his remote control for the TV.  He is not on any special diet  Past Medical History:  Diagnosis Date   Acquired hypothyroidism    Atherosclerosis of native coronary artery of native heart with stable angina pectoris (HCC)    Blood type B+    BMI 27.0-27.9,adult    Diabetes mellitus with stage 3 chronic kidney disease (HCC)    Dyslipidemia    Dyspnea on exertion    Hypertensive heart disease with heart failure (HCC)    Overweight    Recurrent major depression in remission (HCC)    Reflux gastritis    Senile osteopenia    Stage 3b chronic kidney disease (CKD) (HCC)    Vitamin D deficiency     Past Surgical History:  Procedure Laterality Date   CATARACT EXTRACTION, BILATERAL  01/2016   CHOLECYSTECTOMY  2014   CORONARY ARTERY BYPASS GRAFT  1995   TONSILLECTOMY      Current Medications: Current Meds  Medication Sig   aspirin EC 81 MG tablet Take 81 mg by mouth daily.   Cholecalciferol (D3-1000 PO) Take 2,000 Units by mouth daily.   cloNIDine (CATAPRES) 0.2 MG tablet Take 0.2 mg by mouth at bedtime.   fenofibrate (TRICOR) 145 MG tablet Take 145 mg by mouth daily.   furosemide (LASIX) 20  MG tablet Take 20 mg by mouth every other day as needed for edema or fluid.   insulin lispro (HUMALOG) 100 UNIT/ML KwikPen Inject 5 Units into the skin 2 (two) times daily.   LANTUS SOLOSTAR 100 UNIT/ML Solostar Pen Inject 23 Units into the skin at bedtime.   levothyroxine (SYNTHROID) 100 MCG tablet Take 100 mcg by mouth daily.   lisinopril (ZESTRIL) 10 MG tablet Take 20 mg by mouth daily.   nitroGLYCERIN (NITROSTAT) 0.4 MG SL tablet Place 0.4 mg under the tongue every 5 (five) minutes as needed for chest pain.   omega-3 acid ethyl esters (LOVAZA) 1 g capsule Take 2 capsules by mouth 2 (two) times daily.   omeprazole (PRILOSEC) 20 MG capsule Take 20 mg by mouth 2 (two) times daily before a  meal.   simvastatin (ZOCOR) 20 MG tablet Take 20 mg by mouth daily.   [DISCONTINUED] Calcium Carbonate-Vit D-Min (CALCIUM 600+D PLUS MINERALS) 600-400 MG-UNIT TABS Take 1 tablet by mouth daily.   [DISCONTINUED] omeprazole (PRILOSEC) 40 MG capsule Take 40 mg by mouth 2 (two) times daily.     Allergies:   Patient has no known allergies.   Social History   Socioeconomic History   Marital status: Widowed    Spouse name: Not on file   Number of children: Not on file   Years of education: Not on file   Highest education level: Not on file  Occupational History   Not on file  Tobacco Use   Smoking status: Former    Types: Cigarettes   Smokeless tobacco: Never  Substance and Sexual Activity   Alcohol use: Not on file   Drug use: Not on file   Sexual activity: Not on file  Other Topics Concern   Not on file  Social History Narrative   Not on file   Social Determinants of Health   Financial Resource Strain: Not on file  Food Insecurity: Not on file  Transportation Needs: Not on file  Physical Activity: Not on file  Stress: Not on file  Social Connections: Not on file     Family History: The patient's family history includes Colon cancer in his mother. ROS:   Please see the history of present illness.    All 14 point review of systems negative except as described per history of present illness.  EKGs/Labs/Other Studies Reviewed:    The following studies were reviewed today: Stress test described above  EKG:  EKG is  ordered today.  The ekg ordered today demonstrates   Recent Labs: No results found for requested labs within last 365 days.  Recent Lipid Panel No results found for: "CHOL", "TRIG", "HDL", "CHOLHDL", "VLDL", "LDLCALC", "LDLDIRECT"  Physical Exam:    VS:  BP (!) 160/78 (BP Location: Left Arm, Patient Position: Sitting)   Pulse 72   Ht 5\' 9"  (1.753 m)   Wt 191 lb 9.6 oz (86.9 kg)   SpO2 96%   BMI 28.29 kg/m     Wt Readings from Last 3 Encounters:   09/04/22 191 lb 9.6 oz (86.9 kg)     GEN:  Well nourished, well developed in no acute distress HEENT: Normal NECK: No JVD; No carotid bruits LYMPHATICS: No lymphadenopathy CARDIAC: RRR, no murmurs, no rubs, no gallops RESPIRATORY:  Clear to auscultation without rales, wheezing or rhonchi  ABDOMEN: Soft, non-tender, non-distended MUSCULOSKELETAL:  No edema; No deformity  SKIN: Warm and dry NEUROLOGIC:  Alert and oriented x 3 PSYCHIATRIC:  Normal affect   ASSESSMENT:  1. Status post coronary artery bypass graft 1995 [place]   2. Dyslipidemia   3. Diabetes mellitus with stage 3 chronic kidney disease ([place])   4. Essential hypertension    PLAN:    In order of problems listed above:  Coronary artery disease, status post coronary bypass grafting 1995.  He presented with typical angina pectoris CCSC class II/III.  He is taking antiplatelet therapy: Aspirin which I will continue.  I will add Plavix to his medical regimen.  Ideally he should have cardiac catheterization however because of his kidney dysfunction I am reluctant to directly proceed with this approach we will try to manage his medications first, therefore, for now dual antiplatelet therapy.  I will give him nitroglycerin as needed, I will start metoprolol 25 twice daily, I will also put him on Imdur 30 mg daily, I see him back in my office very quickly within 2 weeks to see how he is feeling I told him that cardiac catheterization will be always on the table.  I also told him if pain will not be relieved by nitroglycerin he need to call 911 and go to the emergency room.  I also told him if he started having resting pain or pain in the increase frequency and severity he need to go to the emergency room. Dyslipidemia I did review his K PN which show me data from January of this year with total cholesterol 157 HDL only 20, he is on Zocor 20 as well as on Tricor.  I will stop Zocor and put him on Lipitor 80 Diabetes mellitus to be  followed by internal medicine team.  His hemoglobin A1c 6.2 from summer of this year pretty decent control Essential hypertension blood pressure elevated today but he did not take his medications today   Medication Adjustments/Labs and Tests Ordered: Current medicines are reviewed at length with the patient today.  Concerns regarding medicines are outlined above.  No orders of the defined types were placed in this encounter.  No orders of the defined types were placed in this encounter.   Signed, Park Liter, MD, [facility]. 09/04/2022 1:16 PM    [place]

## 2022-09-09 ENCOUNTER — Ambulatory Visit: Payer: Self-pay | Admitting: Cardiology

## 2022-09-18 ENCOUNTER — Encounter: Payer: Self-pay | Admitting: Cardiology

## 2022-09-18 ENCOUNTER — Ambulatory Visit: Payer: Medicare PPO | Attending: Cardiology | Admitting: Cardiology

## 2022-09-18 VITALS — BP 110/72 | HR 59 | Ht 69.0 in | Wt 195.0 lb

## 2022-09-18 DIAGNOSIS — I10 Essential (primary) hypertension: Secondary | ICD-10-CM | POA: Diagnosis not present

## 2022-09-18 DIAGNOSIS — E785 Hyperlipidemia, unspecified: Secondary | ICD-10-CM

## 2022-09-18 DIAGNOSIS — I25118 Atherosclerotic heart disease of native coronary artery with other forms of angina pectoris: Secondary | ICD-10-CM

## 2022-09-18 DIAGNOSIS — N183 Chronic kidney disease, stage 3 unspecified: Secondary | ICD-10-CM | POA: Diagnosis not present

## 2022-09-18 DIAGNOSIS — Z951 Presence of aortocoronary bypass graft: Secondary | ICD-10-CM | POA: Diagnosis not present

## 2022-09-18 DIAGNOSIS — E1122 Type 2 diabetes mellitus with diabetic chronic kidney disease: Secondary | ICD-10-CM | POA: Diagnosis not present

## 2022-09-18 MED ORDER — ISOSORBIDE MONONITRATE ER 60 MG PO TB24: 60.0000 mg | ORAL_TABLET | Freq: Every day | ORAL | 3 refills | Status: DC

## 2022-09-18 NOTE — Patient Instructions (Signed)
Medication Instructions:  Your physician has recommended you make the following change in your medication:  Increase Imdur to 60 mg once daily. New Rx sent to pharmacy  *If you need a refill on your cardiac medications before your next appointment, please call your pharmacy*   Lab Work: NONE If you have labs (blood work) drawn today and your tests are completely normal, you will receive your results only by: MyChart Message (if you have MyChart) OR A paper copy in the mail If you have any lab test that is abnormal or we need to change your treatment, we will call you to review the results.   Testing/Procedures: NONE   Follow-Up: At [facility], you and your health needs are our priority.  As part of our continuing mission to provide you with exceptional heart care, we have created designated Provider Care Teams.  These Care Teams include your primary Cardiologist (physician) and Advanced Practice Providers (APPs -  Physician Assistants and Nurse Practitioners) who all work together to provide you with the care you need, when you need it.  We recommend signing up for the patient portal called "MyChart".  Sign up information is provided on this After Visit Summary.  MyChart is used to connect with patients for Virtual Visits (Telemedicine).  Patients are able to view lab/test results, encounter notes, upcoming appointments, etc.  Non-urgent messages can be sent to your provider as well.   To learn more about what you can do with MyChart, go to [email or web address].    Your next appointment:   1 month(s)  The format for your next appointment:   In Person  Provider:   Gypsy Balsam, MD    Other Instructions   Important Information About Sugar

## 2022-09-18 NOTE — Addendum Note (Signed)
Addended by: Roosvelt Harps R on: 09/18/2022 09:26 AM   Modules accepted: Orders

## 2022-09-18 NOTE — Progress Notes (Signed)
Cardiology Office Note:    Date:  09/18/2022   ID:  Joshua Morrison, DOB 26-Sep-1941, MRN [id number]  PCP:  Street, Stephanie Coup, MD  Cardiologist:  Gypsy Balsam, MD    Referring MD: Street, Stephanie Coup, *   Chief Complaint  Patient presents with   Results    Stress Test    History of Present Illness:    Joshua Morrison is a 81 y.o. male with past medical history significant for coronary artery disease.  In 1995 he did have coronary bypass graft done at [facility] since that time he was doing quite well however for last couple weeks he started experiencing typical exertional angina pectoris.  Additional problem include longstanding diabetes, essential hypertension, kidney dysfunction with creatinine neighborhood of 2.  When I did see him last time he was not able to do much without getting chest pain.  However, we did rush to cardiac cath laboratory secondary to the fact that he does have some kidney dysfunction I put him on beta-blocker, dual antiplatelet therapy as well as Imdur and he comes today to my office for follow-up he said he feels 1000% better.  Denies have any chest pain tightness squeezing pressure.  Burning in the chest.  He admits however he did not push himself hard.  He is very happy and satisfied where he feels  Past Medical History:  Diagnosis Date   Acquired hypothyroidism    Atherosclerosis of native coronary artery of native heart with stable angina pectoris (HCC)    Blood type B+    BMI 27.0-27.9,adult    Diabetes mellitus with stage 3 chronic kidney disease (HCC)    Dyslipidemia    Dyspnea on exertion    Hypertensive heart disease with heart failure (HCC)    Overweight    Recurrent major depression in remission (HCC)    Reflux gastritis    Senile osteopenia    Stage 3b chronic kidney disease (CKD) (HCC)    Vitamin D deficiency     Past Surgical History:  Procedure Laterality Date   CATARACT EXTRACTION, BILATERAL  01/2016   CHOLECYSTECTOMY  2014    CORONARY ARTERY BYPASS GRAFT  1995   TONSILLECTOMY      Current Medications: Current Meds  Medication Sig   aspirin EC 81 MG tablet Take 81 mg by mouth daily.   atorvastatin (LIPITOR) 80 MG tablet Take 1 tablet (80 mg total) by mouth daily.   Cholecalciferol (D3-1000 PO) Take 2,000 Units by mouth daily.   cloNIDine (CATAPRES) 0.2 MG tablet Take 0.2 mg by mouth at bedtime.   clopidogrel (PLAVIX) 75 MG tablet Take 1 tablet (75 mg total) by mouth daily.   fenofibrate (TRICOR) 145 MG tablet Take 145 mg by mouth daily.   furosemide (LASIX) 20 MG tablet Take 20 mg by mouth every other day as needed for edema or fluid.   insulin lispro (HUMALOG) 100 UNIT/ML KwikPen Inject 5 Units into the skin 2 (two) times daily.   isosorbide mononitrate (IMDUR) 30 MG 24 hr tablet Take 1 tablet (30 mg total) by mouth daily.   LANTUS SOLOSTAR 100 UNIT/ML Solostar Pen Inject 23 Units into the skin at bedtime.   levothyroxine (SYNTHROID) 100 MCG tablet Take 100 mcg by mouth daily.   lisinopril (ZESTRIL) 10 MG tablet Take 20 mg by mouth daily.   metoprolol tartrate (LOPRESSOR) 25 MG tablet Take 1 tablet (25 mg total) by mouth 2 (two) times daily.   nitroGLYCERIN (NITROSTAT) 0.4 MG SL tablet  Place 1 tablet (0.4 mg total) under the tongue every 5 (five) minutes as needed for chest pain.   omega-3 acid ethyl esters (LOVAZA) 1 g capsule Take 2 capsules by mouth 2 (two) times daily.   omeprazole (PRILOSEC) 20 MG capsule Take 20 mg by mouth 2 (two) times daily before a meal.     Allergies:   Patient has no known allergies.   Social History   Socioeconomic History   Marital status: Widowed    Spouse name: Not on file   Number of children: Not on file   Years of education: Not on file   Highest education level: Not on file  Occupational History   Not on file  Tobacco Use   Smoking status: Former    Types: Cigarettes   Smokeless tobacco: Never  Substance and Sexual Activity   Alcohol use: Not on file    Drug use: Not on file   Sexual activity: Not on file  Other Topics Concern   Not on file  Social History Narrative   Not on file   Social Determinants of Health   Financial Resource Strain: Not on file  Food Insecurity: Not on file  Transportation Needs: Not on file  Physical Activity: Not on file  Stress: Not on file  Social Connections: Not on file     Family History: The patient's family history includes Colon cancer in his mother. ROS:   Please see the history of present illness.    All 14 point review of systems negative except as described per history of present illness  EKGs/Labs/Other Studies Reviewed:      Recent Labs: No results found for requested labs within last 365 days.  Recent Lipid Panel No results found for: "CHOL", "TRIG", "HDL", "CHOLHDL", "VLDL", "LDLCALC", "LDLDIRECT"  Physical Exam:    VS:  BP 110/72 (BP Location: Left Arm, Patient Position: Sitting)   Pulse (!) 59   Ht 5\' 9"  (1.753 m)   Wt 195 lb (88.5 kg)   SpO2 98%   BMI 28.80 kg/m     Wt Readings from Last 3 Encounters:  09/18/22 195 lb (88.5 kg)  09/04/22 191 lb 9.6 oz (86.9 kg)     GEN:  Well nourished, well developed in no acute distress HEENT: Normal NECK: No JVD; No carotid bruits LYMPHATICS: No lymphadenopathy CARDIAC: RRR, no murmurs, no rubs, no gallops RESPIRATORY:  Clear to auscultation without rales, wheezing or rhonchi  ABDOMEN: Soft, non-tender, non-distended MUSCULOSKELETAL:  No edema; No deformity  SKIN: Warm and dry LOWER EXTREMITIES: no swelling NEUROLOGIC:  Alert and oriented x 3 PSYCHIATRIC:  Normal affect   ASSESSMENT:    1. Coronary artery disease of native artery of native heart with stable angina pectoris (HCC)   2. Essential hypertension   3. Diabetes mellitus with stage 3 chronic kidney disease (HCC)   4. Dyslipidemia   5. Status post coronary artery bypass graft 1995 [place]    PLAN:    In order of problems listed above:  Coronary disease.   Stable angina pectoris.  I will increase dose of Imdur to 60 mg daily and ask him to try to push himself a little bit to see if he still gets some episode of angina pectoris.  Previously he was 1996 class III now practically 1. Essential hypertension blood pressure well-controlled continue present management.  Dyslipidemia he is Lipitor Tricor and Lovaza which I will continue. Status post coronary bypass graft noted. Kidney dysfunction.  Noted.  Continue  monitoring.   Medication Adjustments/Labs and Tests Ordered: Current medicines are reviewed at length with the patient today.  Concerns regarding medicines are outlined above.  No orders of the defined types were placed in this encounter.  Medication changes: No orders of the defined types were placed in this encounter.   Signed, Georgeanna Lea, MD, [facility] 09/18/2022 9:18 AM    [facility]

## 2022-09-18 NOTE — Addendum Note (Signed)
Addended by: Roosvelt Harps R on: 09/18/2022 09:42 AM   Modules accepted: Orders

## 2022-09-22 ENCOUNTER — Ambulatory Visit: Payer: Medicare PPO | Admitting: Cardiology

## 2022-10-15 DIAGNOSIS — I10 Essential (primary) hypertension: Secondary | ICD-10-CM | POA: Diagnosis not present

## 2022-10-15 DIAGNOSIS — E7800 Pure hypercholesterolemia, unspecified: Secondary | ICD-10-CM | POA: Diagnosis not present

## 2022-10-15 DIAGNOSIS — N189 Chronic kidney disease, unspecified: Secondary | ICD-10-CM | POA: Diagnosis not present

## 2022-10-15 DIAGNOSIS — Z8679 Personal history of other diseases of the circulatory system: Secondary | ICD-10-CM | POA: Diagnosis not present

## 2022-10-15 DIAGNOSIS — E109 Type 1 diabetes mellitus without complications: Secondary | ICD-10-CM | POA: Diagnosis not present

## 2022-10-15 DIAGNOSIS — E890 Postprocedural hypothyroidism: Secondary | ICD-10-CM | POA: Diagnosis not present

## 2022-10-15 DIAGNOSIS — R809 Proteinuria, unspecified: Secondary | ICD-10-CM | POA: Diagnosis not present

## 2022-10-17 ENCOUNTER — Encounter: Payer: Self-pay | Admitting: Cardiology

## 2022-10-17 ENCOUNTER — Ambulatory Visit: Payer: Medicare PPO | Attending: Cardiology | Admitting: Cardiology

## 2022-10-17 VITALS — BP 120/60 | HR 42 | Ht 69.0 in | Wt 192.0 lb

## 2022-10-17 DIAGNOSIS — R0609 Other forms of dyspnea: Secondary | ICD-10-CM | POA: Diagnosis not present

## 2022-10-17 DIAGNOSIS — Z951 Presence of aortocoronary bypass graft: Secondary | ICD-10-CM

## 2022-10-17 DIAGNOSIS — E785 Hyperlipidemia, unspecified: Secondary | ICD-10-CM | POA: Diagnosis not present

## 2022-10-17 DIAGNOSIS — I25118 Atherosclerotic heart disease of native coronary artery with other forms of angina pectoris: Secondary | ICD-10-CM | POA: Diagnosis not present

## 2022-10-17 DIAGNOSIS — I10 Essential (primary) hypertension: Secondary | ICD-10-CM

## 2022-10-17 MED ORDER — ISOSORBIDE MONONITRATE ER 60 MG PO TB24: 60.0000 mg | ORAL_TABLET | Freq: Every day | ORAL | 3 refills | Status: DC

## 2022-10-17 MED ORDER — METOPROLOL TARTRATE 25 MG PO TABS: 12.5000 mg | ORAL_TABLET | Freq: Two times a day (BID) | ORAL | 3 refills | Status: DC

## 2022-10-17 NOTE — Patient Instructions (Signed)
Medication Instructions:  Your physician has recommended you make the following change in your medication:   START: Imdur 60 mg daily START: Metoprolol tartrate 12.5 mg twice daily  *If you need a refill on your cardiac medications before your next appointment, please call your pharmacy*   Lab Work: None If you have labs (blood work) drawn today and your tests are completely normal, you will receive your results only by: [place] (if you have MyChart) OR A paper copy in the mail If you have any lab test that is abnormal or we need to change your treatment, we will call you to review the results.   Testing/Procedures: None   Follow-Up: At [facility], you and your health needs are our priority.  As part of our continuing mission to provide you with exceptional heart care, we have created designated Provider Care Teams.  These Care Teams include your primary Cardiologist (physician) and Advanced Practice Providers (APPs -  Physician Assistants and Nurse Practitioners) who all work together to provide you with the care you need, when you need it.  We recommend signing up for the patient portal called "MyChart".  Sign up information is provided on this After Visit Summary.  MyChart is used to connect with patients for Virtual Visits (Telemedicine).  Patients are able to view lab/test results, encounter notes, upcoming appointments, etc.  Non-urgent messages can be sent to your provider as well.   To learn more about what you can do with MyChart, go to [email or web address].    Your next appointment:   3 month(s)  Provider:   Jenne Campus, MD    Other Instructions None

## 2022-10-17 NOTE — Progress Notes (Unsigned)
Cardiology Office Note:    Date:  10/17/2022   ID:  Joshua Morrison, DOB Aug 23, 1941, MRN [id number]  PCP:  Street, Sharon Mt, MD  Cardiologist:  Jenne Campus, MD    Referring MD: Street, Sharon Mt, *   Chief Complaint  Patient presents with   Follow-up    History of Present Illness:    Joshua Morrison is a 82 y.o. male  with past medical history significant for coronary artery disease. In 1995 he did have coronary bypass graft done at [facility] since that time he was doing quite well however for last couple weeks he started experiencing typical exertional angina pectoris. Additional problem include longstanding diabetes, essential hypertension, kidney dysfunction with creatinine neighborhood of 2. When I did see him last time he was not able to do much without getting chest pain. However, we did rush to cardiac cath laboratory secondary to the fact that he does have some kidney dysfunction I put him on beta-blocker, dual antiplatelet therapy as well as Imdur and he comes today to my office for follow-up he said he feels 1000% better.  He comes today 2 months for follow-up.  Overall he is doing fine denies have any chest pain tightness squeezing pressure burning chest.  Last time we spoke to conclusion was that he needs to start doing some exercises slowly gradually get up to speed however he did not do it he was afraid to do that overall seems to be doing well.  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   atorvastatin (LIPITOR) 80 MG tablet Take 1 tablet (80 mg total) by mouth daily.   Cholecalciferol (D3-1000 PO) Take 2,000 Units by mouth daily.   cloNIDine (CATAPRES) 0.2 MG tablet Take 0.2 mg by mouth at bedtime.   clopidogrel (PLAVIX) 75 MG tablet Take 1 tablet (75 mg total) by mouth daily.   fenofibrate (TRICOR) 145 MG tablet Take 145 mg by mouth daily.   furosemide (LASIX) 20 MG tablet Take 20 mg by mouth every other day as needed for edema or fluid.   insulin glargine (LANTUS) 100 UNIT/ML injection Inject 20 Units into the skin at bedtime.   insulin lispro (HUMALOG) 100 UNIT/ML KwikPen Inject 5 Units into the skin 2 (two) times daily.   isosorbide mononitrate (IMDUR) 60 MG 24 hr tablet Take 1 tablet (60 mg total) by mouth daily.   LANTUS SOLOSTAR 100 UNIT/ML Solostar Pen Inject 23 Units into the skin at bedtime.   levothyroxine (SYNTHROID) 100 MCG tablet Take 100 mcg by mouth daily.   lisinopril (  ZESTRIL) 10 MG tablet Take 20 mg by mouth daily.   metoprolol tartrate (LOPRESSOR) 25 MG tablet Take 1 tablet (25 mg total) by mouth 2 (two) times daily.   nitroGLYCERIN (NITROSTAT) 0.4 MG SL tablet Place 1 tablet (0.4 mg total) under the tongue every 5 (five) minutes as needed for chest pain.   omega-3 acid ethyl esters (LOVAZA) 1 g capsule Take 2 capsules by mouth 2 (two) times daily.   omeprazole (PRILOSEC) 20 MG capsule Take 20 mg by mouth 2 (two) times daily before a meal.     Allergies:   Patient has no known allergies.   Social History   Socioeconomic History   Marital status: Widowed    Spouse name: Not on file   Number of children: Not on file   Years of education: Not on file    Highest education level: Not on file  Occupational History   Not on file  Tobacco Use   Smoking status: Former    Types: Cigarettes   Smokeless tobacco: Never  Substance and Sexual Activity   Alcohol use: Not on file   Drug use: Not on file   Sexual activity: Not on file  Other Topics Concern   Not on file  Social History Narrative   Not on file   Social Determinants of Health   Financial Resource Strain: Not on file  Food Insecurity: Not on file  Transportation Needs: Not on file  Physical Activity: Not on file  Stress: Not on file  Social Connections: Not on file     Family History: The patient's family history includes Colon cancer in his mother. ROS:   Please see the history of present illness.    All 14 point review of systems negative except as described per history of present illness  EKGs/Labs/Other Studies Reviewed:      Recent Labs: No results found for requested labs within last 365 days.  Recent Lipid Panel No results found for: "CHOL", "TRIG", "HDL", "CHOLHDL", "VLDL", "LDLCALC", "LDLDIRECT"  Physical Exam:    VS:  BP 120/60 (BP Location: Right Arm, Patient Position: Sitting, Cuff Size: Normal)   Pulse (!) 42   Ht 5\' 9"  (1.753 m)   Wt 192 lb (87.1 kg)   SpO2 96%   BMI 28.35 kg/m     Wt Readings from Last 3 Encounters:  10/17/22 192 lb (87.1 kg)  09/18/22 195 lb (88.5 kg)  09/04/22 191 lb 9.6 oz (86.9 kg)     GEN:  Well nourished, well developed in no acute distress HEENT: Normal NECK: No JVD; No carotid bruits LYMPHATICS: No lymphadenopathy CARDIAC: RRR, no murmurs, no rubs, no gallops RESPIRATORY:  Clear to auscultation without rales, wheezing or rhonchi  ABDOMEN: Soft, non-tender, non-distended MUSCULOSKELETAL:  No edema; No deformity  SKIN: Warm and dry LOWER EXTREMITIES: no swelling NEUROLOGIC:  Alert and oriented x 3 PSYCHIATRIC:  Normal affect   ASSESSMENT:    1. Coronary artery disease of native artery of native heart with  stable angina pectoris ([place])   2. Essential hypertension   3. Dyspnea on exertion   4. Dyslipidemia   5. Status post coronary artery bypass graft 1995 [place]    PLAN:    In order of problems listed above:  Coronary artery disease abnormal stress test, chest pain, trying to manage medically because of kidney dysfunction making progress.  Tolerating medication well.  Denies have any symptoms I encouraged him to BL be more active and see how can tolerate  exercises. Essential hypertension: Blood pressure well-controlled continue present management. Bradycardia.  I will ask him to have EKG done today to check the rhythm.  He does not have any dizziness passing out no nightmares. Dyslipidemia I did review K PN which show me cholesterol total of 157 HDL 20.  Continue present management   Medication Adjustments/Labs and Tests Ordered: Current medicines are reviewed at length with the patient today.  Concerns regarding medicines are outlined above.  No orders of the defined types were placed in this encounter.  Medication changes: No orders of the defined types were placed in this encounter.   Signed, Georgeanna Lea, MD, [facility] 10/17/2022 10:21 AM    [facility]

## 2022-10-23 DIAGNOSIS — N183 Chronic kidney disease, stage 3 unspecified: Secondary | ICD-10-CM | POA: Diagnosis not present

## 2022-10-23 DIAGNOSIS — Z0000 Encounter for general adult medical examination without abnormal findings: Secondary | ICD-10-CM | POA: Diagnosis not present

## 2022-10-23 DIAGNOSIS — I110 Hypertensive heart disease with heart failure: Secondary | ICD-10-CM | POA: Diagnosis not present

## 2022-10-23 DIAGNOSIS — E1122 Type 2 diabetes mellitus with diabetic chronic kidney disease: Secondary | ICD-10-CM | POA: Diagnosis not present

## 2022-10-23 DIAGNOSIS — N1832 Chronic kidney disease, stage 3b: Secondary | ICD-10-CM | POA: Diagnosis not present

## 2022-10-23 DIAGNOSIS — E559 Vitamin D deficiency, unspecified: Secondary | ICD-10-CM | POA: Diagnosis not present

## 2022-10-23 DIAGNOSIS — I25118 Atherosclerotic heart disease of native coronary artery with other forms of angina pectoris: Secondary | ICD-10-CM | POA: Diagnosis not present

## 2022-10-23 DIAGNOSIS — Z125 Encounter for screening for malignant neoplasm of prostate: Secondary | ICD-10-CM | POA: Diagnosis not present

## 2022-10-23 DIAGNOSIS — E785 Hyperlipidemia, unspecified: Secondary | ICD-10-CM | POA: Diagnosis not present

## 2022-11-17 DIAGNOSIS — H40013 Open angle with borderline findings, low risk, bilateral: Secondary | ICD-10-CM | POA: Diagnosis not present

## 2022-11-17 DIAGNOSIS — H47233 Glaucomatous optic atrophy, bilateral: Secondary | ICD-10-CM | POA: Diagnosis not present

## 2022-12-16 DIAGNOSIS — L578 Other skin changes due to chronic exposure to nonionizing radiation: Secondary | ICD-10-CM | POA: Diagnosis not present

## 2022-12-16 DIAGNOSIS — L821 Other seborrheic keratosis: Secondary | ICD-10-CM | POA: Diagnosis not present

## 2022-12-16 DIAGNOSIS — C44319 Basal cell carcinoma of skin of other parts of face: Secondary | ICD-10-CM | POA: Diagnosis not present

## 2022-12-16 DIAGNOSIS — L570 Actinic keratosis: Secondary | ICD-10-CM | POA: Diagnosis not present

## 2023-01-07 DIAGNOSIS — I2510 Atherosclerotic heart disease of native coronary artery without angina pectoris: Secondary | ICD-10-CM | POA: Diagnosis not present

## 2023-01-07 DIAGNOSIS — E559 Vitamin D deficiency, unspecified: Secondary | ICD-10-CM | POA: Diagnosis not present

## 2023-01-07 DIAGNOSIS — I129 Hypertensive chronic kidney disease with stage 1 through stage 4 chronic kidney disease, or unspecified chronic kidney disease: Secondary | ICD-10-CM | POA: Diagnosis not present

## 2023-01-07 DIAGNOSIS — E039 Hypothyroidism, unspecified: Secondary | ICD-10-CM | POA: Diagnosis not present

## 2023-01-07 DIAGNOSIS — D631 Anemia in chronic kidney disease: Secondary | ICD-10-CM | POA: Diagnosis not present

## 2023-01-07 DIAGNOSIS — N1832 Chronic kidney disease, stage 3b: Secondary | ICD-10-CM | POA: Diagnosis not present

## 2023-01-07 DIAGNOSIS — E1022 Type 1 diabetes mellitus with diabetic chronic kidney disease: Secondary | ICD-10-CM | POA: Diagnosis not present

## 2023-01-16 ENCOUNTER — Encounter: Payer: Self-pay | Admitting: Cardiology

## 2023-01-16 ENCOUNTER — Ambulatory Visit: Payer: Medicare PPO | Attending: Cardiology | Admitting: Cardiology

## 2023-01-16 VITALS — BP 138/62 | HR 63 | Ht 69.0 in | Wt 189.6 lb

## 2023-01-16 DIAGNOSIS — N183 Chronic kidney disease, stage 3 unspecified: Secondary | ICD-10-CM

## 2023-01-16 DIAGNOSIS — E1122 Type 2 diabetes mellitus with diabetic chronic kidney disease: Secondary | ICD-10-CM | POA: Diagnosis not present

## 2023-01-16 DIAGNOSIS — I25118 Atherosclerotic heart disease of native coronary artery with other forms of angina pectoris: Secondary | ICD-10-CM

## 2023-01-16 DIAGNOSIS — I10 Essential (primary) hypertension: Secondary | ICD-10-CM | POA: Diagnosis not present

## 2023-01-16 DIAGNOSIS — Z951 Presence of aortocoronary bypass graft: Secondary | ICD-10-CM | POA: Diagnosis not present

## 2023-01-16 NOTE — Patient Instructions (Signed)
Medication Instructions:  Your physician recommends that you continue on your current medications as directed. Please refer to the Current Medication list given to you today.  *If you need a refill on your cardiac medications before your next appointment, please call your pharmacy*   Lab Work: NONE If you have labs (blood work) drawn today and your tests are completely normal, you will receive your results only by: MyChart Message (if you have MyChart) OR A paper copy in the mail If you have any lab test that is abnormal or we need to change your treatment, we will call you to review the results.   Testing/Procedures: NONE   Follow-Up: At [facility], you and your health needs are our priority.  As part of our continuing mission to provide you with exceptional heart care, we have created designated Provider Care Teams.  These Care Teams include your primary Cardiologist (physician) and Advanced Practice Providers (APPs -  Physician Assistants and Nurse Practitioners) who all work together to provide you with the care you need, when you need it.  We recommend signing up for the patient portal called "MyChart".  Sign up information is provided on this After Visit Summary.  MyChart is used to connect with patients for Virtual Visits (Telemedicine).  Patients are able to view lab/test results, encounter notes, upcoming appointments, etc.  Non-urgent messages can be sent to your provider as well.   To learn more about what you can do with MyChart, go to [email or web address].    Your next appointment:   5 month(s)  Provider:   Gypsy Balsam, MD    Other Instructions

## 2023-01-16 NOTE — Progress Notes (Signed)
Cardiology Office Note:    Date:  01/16/2023   ID:  Joshua Morrison, DOB September 15, 1941, MRN [id number]  PCP:  Street, Joshua Coup, MD  Cardiologist:  Joshua Balsam, MD    Referring MD: [address], Joshua Morrison, *   No chief complaint on file. Doing very well  History of Present Illness:    Joshua Morrison is a 82 y.o. male with past medical history significant for coronary artery disease.  In 1995 he did have coronary bypass graft done at [facility] since that time he was doing quite well however recently started experiencing typical exertional chest pain, stress test has been done which was abnormal.  He was sent to my office for evaluation.  We did not rush to cardiac catheterization because his creatinine is negative to we initiated guideline directed medical therapy and he is feeling dramatically better.  He is telling me he can do what ever he wants to do without chest pain one of the example he gave me is the fact that he got about 800 feet to carry his garbage can to do straight.  Before he have to stop 2 or 3 times because of chest tightness now he can goes back and forth without stopping.  He is feeling very proud of himself and he started pushing more exercises and doing well from that point review.  Past Medical History:  Diagnosis Date   Acquired hypothyroidism    Atherosclerosis of native coronary artery of native heart with stable angina pectoris    Blood type B+    BMI 27.0-27.9,adult    Diabetes mellitus with stage 3 chronic kidney disease    Dyslipidemia    Dyspnea on exertion    Hypertensive heart disease with heart failure    Overweight    Recurrent major depression in remission    Reflux gastritis    Senile osteopenia    Stage 3b chronic kidney disease (CKD)    Vitamin D deficiency     Past Surgical History:  Procedure Laterality Date   CATARACT EXTRACTION, BILATERAL  01/2016   CHOLECYSTECTOMY  2014   CORONARY ARTERY BYPASS GRAFT  1995   TONSILLECTOMY       Current Medications: Current Meds  Medication Sig   aspirin EC 81 MG tablet Take 81 mg by mouth daily.   atorvastatin (LIPITOR) 80 MG tablet Take 1 tablet (80 mg total) by mouth daily.   Cholecalciferol (D3-1000 PO) Take 2,000 Units by mouth daily.   clopidogrel (PLAVIX) 75 MG tablet Take 1 tablet (75 mg total) by mouth daily.   fenofibrate (TRICOR) 145 MG tablet Take 145 mg by mouth daily.   furosemide (LASIX) 20 MG tablet Take 20 mg by mouth every other day as needed for edema or fluid.   insulin glargine (LANTUS) 100 UNIT/ML injection Inject 20 Units into the skin at bedtime.   insulin lispro (HUMALOG) 100 UNIT/ML KwikPen Inject 5 Units into the skin 2 (two) times daily.   isosorbide mononitrate (IMDUR) 60 MG 24 hr tablet Take 1 tablet (60 mg total) by mouth daily.   LANTUS SOLOSTAR 100 UNIT/ML Solostar Pen Inject 23 Units into the skin at bedtime.   levothyroxine (SYNTHROID) 100 MCG tablet Take 100 mcg by mouth daily.   lisinopril (ZESTRIL) 10 MG tablet Take 20 mg by mouth daily.   metoprolol tartrate (LOPRESSOR) 25 MG tablet Take 0.5 tablets (12.5 mg total) by mouth 2 (two) times daily.   nitroGLYCERIN (NITROSTAT) 0.4 MG SL tablet Place 1  tablet (0.4 mg total) under the tongue every 5 (five) minutes as needed for chest pain.   omega-3 acid ethyl esters (LOVAZA) 1 g capsule Take 2 capsules by mouth 2 (two) times daily.   omeprazole (PRILOSEC) 20 MG capsule Take 20 mg by mouth 2 (two) times daily before a meal.     Allergies:   Patient has no known allergies.   Social History   Socioeconomic History   Marital status: Widowed    Spouse name: Not on file   Number of children: Not on file   Years of education: Not on file   Highest education level: Not on file  Occupational History   Not on file  Tobacco Use   Smoking status: Former    Types: Cigarettes   Smokeless tobacco: Never  Substance and Sexual Activity   Alcohol use: Not on file   Drug use: Not on file   Sexual  activity: Not on file  Other Topics Concern   Not on file  Social History Narrative   Not on file   Social Determinants of Health   Financial Resource Strain: Not on file  Food Insecurity: Not on file  Transportation Needs: Not on file  Physical Activity: Not on file  Stress: Not on file  Social Connections: Not on file     Family History: The patient's family history includes Colon cancer in his mother. ROS:   Please see the history of present illness.    All 14 point review of systems negative except as described per history of present illness  EKGs/Labs/Other Studies Reviewed:      Recent Labs: No results found for requested labs within last 365 days.  Recent Lipid Panel No results found for: "CHOL", "TRIG", "HDL", "CHOLHDL", "VLDL", "LDLCALC", "LDLDIRECT"  Physical Exam:    VS:  BP 138/62 (BP Location: Left Arm, Patient Position: Sitting)   Pulse 63   Ht  (1.753 m)   Wt 189 lb 9.6 oz (86 kg)   SpO2 97%   BMI 28.00 kg/m     Wt Readings from Last 3 Encounters:  01/16/23 189 lb 9.6 oz (86 kg)  10/17/22 192 lb (87.1 kg)  09/18/22 195 lb (88.5 kg)     GEN:  Well nourished, well developed in no acute distress HEENT: Normal NECK: No JVD; No carotid bruits LYMPHATICS: No lymphadenopathy CARDIAC: RRR, no murmurs, no rubs, no gallops RESPIRATORY:  Clear to auscultation without rales, wheezing or rhonchi  ABDOMEN: Soft, non-tender, non-distended MUSCULOSKELETAL:  No edema; No deformity  SKIN: Warm and dry LOWER EXTREMITIES: no swelling NEUROLOGIC:  Alert and oriented x 3 PSYCHIATRIC:  Normal affect   ASSESSMENT:    1. Coronary artery disease of native artery of native heart with stable angina pectoris   2. Essential hypertension   3. Diabetes mellitus with stage 3 chronic kidney disease   4. Status post coronary artery bypass graft 1995 [place]    PLAN:    In order of problems listed above:  Coronary disease stable medical therapy which I will  continue. Essential hypertension blood pressure well-controlled continue present management. Dyslipidemia I did review his K PN LDL 62 HDL 20.  Will continue present medications which include Lovaza as well as Lipitor 80. Diabetes that being followed by internal medicine team.  Last hemoglobin A1c is from January 2024 which was 6.2.  Continue present management   Medication Adjustments/Labs and Tests Ordered: Current medicines are reviewed at length with the patient today.  Concerns regarding medicines are outlined above.  No orders of the defined types were placed in this encounter.  Medication changes: No orders of the defined types were placed in this encounter.   Signed, Georgeanna Lea, MD, [facility] 01/16/2023 10:13 AM    [facility]

## 2023-01-28 DIAGNOSIS — F3340 Major depressive disorder, recurrent, in remission, unspecified: Secondary | ICD-10-CM | POA: Diagnosis not present

## 2023-01-28 DIAGNOSIS — E039 Hypothyroidism, unspecified: Secondary | ICD-10-CM | POA: Diagnosis not present

## 2023-01-28 DIAGNOSIS — E1122 Type 2 diabetes mellitus with diabetic chronic kidney disease: Secondary | ICD-10-CM | POA: Diagnosis not present

## 2023-01-28 DIAGNOSIS — K2960 Other gastritis without bleeding: Secondary | ICD-10-CM | POA: Diagnosis not present

## 2023-01-28 DIAGNOSIS — N183 Chronic kidney disease, stage 3 unspecified: Secondary | ICD-10-CM | POA: Diagnosis not present

## 2023-01-28 DIAGNOSIS — I110 Hypertensive heart disease with heart failure: Secondary | ICD-10-CM | POA: Diagnosis not present

## 2023-01-28 DIAGNOSIS — E785 Hyperlipidemia, unspecified: Secondary | ICD-10-CM | POA: Diagnosis not present

## 2023-01-28 DIAGNOSIS — I25118 Atherosclerotic heart disease of native coronary artery with other forms of angina pectoris: Secondary | ICD-10-CM | POA: Diagnosis not present

## 2023-01-28 DIAGNOSIS — N1832 Chronic kidney disease, stage 3b: Secondary | ICD-10-CM | POA: Diagnosis not present

## 2023-03-06 DIAGNOSIS — H40013 Open angle with borderline findings, low risk, bilateral: Secondary | ICD-10-CM | POA: Diagnosis not present

## 2023-03-06 DIAGNOSIS — H47233 Glaucomatous optic atrophy, bilateral: Secondary | ICD-10-CM | POA: Diagnosis not present

## 2023-04-23 DIAGNOSIS — Z8679 Personal history of other diseases of the circulatory system: Secondary | ICD-10-CM | POA: Diagnosis not present

## 2023-04-23 DIAGNOSIS — E890 Postprocedural hypothyroidism: Secondary | ICD-10-CM | POA: Diagnosis not present

## 2023-04-23 DIAGNOSIS — E109 Type 1 diabetes mellitus without complications: Secondary | ICD-10-CM | POA: Diagnosis not present

## 2023-04-23 DIAGNOSIS — E7800 Pure hypercholesterolemia, unspecified: Secondary | ICD-10-CM | POA: Diagnosis not present

## 2023-04-23 DIAGNOSIS — I10 Essential (primary) hypertension: Secondary | ICD-10-CM | POA: Diagnosis not present

## 2023-04-23 DIAGNOSIS — N189 Chronic kidney disease, unspecified: Secondary | ICD-10-CM | POA: Diagnosis not present

## 2023-04-23 DIAGNOSIS — R809 Proteinuria, unspecified: Secondary | ICD-10-CM | POA: Diagnosis not present

## 2023-06-18 ENCOUNTER — Ambulatory Visit: Payer: Medicare PPO | Attending: Cardiology | Admitting: Cardiology

## 2023-06-18 ENCOUNTER — Ambulatory Visit: Payer: Medicare PPO | Attending: Cardiology

## 2023-06-18 ENCOUNTER — Encounter: Payer: Self-pay | Admitting: Cardiology

## 2023-06-18 VITALS — BP 150/94 | HR 82 | Ht 69.0 in | Wt 185.6 lb

## 2023-06-18 DIAGNOSIS — E1122 Type 2 diabetes mellitus with diabetic chronic kidney disease: Secondary | ICD-10-CM | POA: Diagnosis not present

## 2023-06-18 DIAGNOSIS — I10 Essential (primary) hypertension: Secondary | ICD-10-CM

## 2023-06-18 DIAGNOSIS — Z951 Presence of aortocoronary bypass graft: Secondary | ICD-10-CM | POA: Diagnosis not present

## 2023-06-18 DIAGNOSIS — N183 Chronic kidney disease, stage 3 unspecified: Secondary | ICD-10-CM | POA: Diagnosis not present

## 2023-06-18 DIAGNOSIS — I25118 Atherosclerotic heart disease of native coronary artery with other forms of angina pectoris: Secondary | ICD-10-CM | POA: Diagnosis not present

## 2023-06-18 DIAGNOSIS — Z794 Long term (current) use of insulin: Secondary | ICD-10-CM | POA: Diagnosis not present

## 2023-06-18 DIAGNOSIS — R002 Palpitations: Secondary | ICD-10-CM

## 2023-06-18 NOTE — Patient Instructions (Signed)
Medication Instructions:  Your physician recommends that you continue on your current medications as directed. Please refer to the Current Medication list given to you today.  *If you need a refill on your cardiac medications before your next appointment, please call your pharmacy*   Lab Work: None Ordered If you have labs (blood work) drawn today and your tests are completely normal, you will receive your results only by: MyChart Message (if you have MyChart) OR A paper copy in the mail If you have any lab test that is abnormal or we need to change your treatment, we will call you to review the results.   Testing/Procedures:  WHY IS MY DOCTOR PRESCRIBING ZIO? The Zio system is proven and trusted by physicians to detect and diagnose irregular heart rhythms -- and has been prescribed to hundreds of thousands of patients.  The FDA has cleared the Zio system to monitor for many different kinds of irregular heart rhythms. In a study, physicians were able to reach a diagnosis 90% of the time with the Zio system1.  You can wear the Zio monitor -- a small, discreet, comfortable patch -- during your normal day-to-day activity, including while you sleep, shower, and exercise, while it records every single heartbeat for analysis.  1Barrett, P., et al. Comparison of 24 Hour Holter Monitoring Versus 14 Day Novel Adhesive Patch Electrocardiographic Monitoring. American Journal of Medicine, 2014.  ZIO VS. HOLTER MONITORING The Zio monitor can be comfortably worn for up to 14 days. Holter monitors can be worn for 24 to 48 hours, limiting the time to record any irregular heart rhythms you may have. Zio is able to capture data for the 51% of patients who have their first symptom-triggered arrhythmia after 48 hours.1  LIVE WITHOUT RESTRICTIONS The Zio ambulatory cardiac monitor is a small, unobtrusive, and water-resistant patch--you might even forget you're wearing it. The Zio monitor records and stores  every beat of your heart, whether you're sleeping, working out, or showering.     Follow-Up: At [facility], you and your health needs are our priority.  As part of our continuing mission to provide you with exceptional heart care, we have created designated Provider Care Teams.  These Care Teams include your primary Cardiologist (physician) and Advanced Practice Providers (APPs -  Physician Assistants and Nurse Practitioners) who all work together to provide you with the care you need, when you need it.  We recommend signing up for the patient portal called "MyChart".  Sign up information is provided on this After Visit Summary.  MyChart is used to connect with patients for Virtual Visits (Telemedicine).  Patients are able to view lab/test results, encounter notes, upcoming appointments, etc.  Non-urgent messages can be sent to your provider as well.   To learn more about what you can do with MyChart, go to https://www.mychart.com.    Your next appointment:   3 month(s)  The format for your next appointment:   In Person  Provider:   Robert Krasowski, MD    Other Instructions NA  

## 2023-06-18 NOTE — Addendum Note (Signed)
Addended by: Baldo Ash D on: 06/18/2023 11:42 AM   Modules accepted: Orders

## 2023-06-18 NOTE — Progress Notes (Signed)
Cardiology Office Note:    Date:  06/18/2023   ID:  Joshua Morrison, DOB 1941-07-19, MRN [id number]  PCP:  Street, Stephanie Coup, MD  Cardiologist:  Gypsy Balsam, MD    Referring MD: Street, Stephanie Coup, *   Chief Complaint  Patient presents with   Follow-up    History of Present Illness:    Joshua Morrison is a 82 y.o. male with past medical history significant for coronary artery disease.  In 1995 he had coronary bypass graft done at [facility].  Since that time he was doing quite well until he started having some typical exertional chest pain.  Stress test being done which was abnormal at that time we had a discussion about potential doing cardiac catheterization but because of his creatinine being elevated we elected to try medical therapy and he feels dramatically better on it very active can walk and do things he cannot do as much as he was able to do when he was sweating but still doing very well he comes today to me with complaining of some dizziness that happened on Monday he said he was taking shower started getting dizzy and unsteady he said he felt like he would be walking on the boat.  That day he did not do much eventually started feeling better and since that time he is doing fine.  Past Medical History:  Diagnosis Date   Acquired hypothyroidism    Atherosclerosis of native coronary artery of native heart with stable angina pectoris (HCC)    Blood type B+    BMI 27.0-27.9,adult    Diabetes mellitus with stage 3 chronic kidney disease (HCC)    Dyslipidemia    Dyspnea on exertion    Hypertensive heart disease with heart failure (HCC)    Overweight    Recurrent major depression in remission (HCC)    Reflux gastritis    Senile osteopenia    Stage 3b chronic kidney disease (CKD) (HCC)    Vitamin D deficiency     Past Surgical History:  Procedure Laterality Date   CATARACT EXTRACTION, BILATERAL  01/2016   CHOLECYSTECTOMY  2014   CORONARY ARTERY BYPASS GRAFT   1995   TONSILLECTOMY      Current Medications: Current Meds  Medication Sig   aspirin EC 81 MG tablet Take 81 mg by mouth daily.   atorvastatin (LIPITOR) 80 MG tablet Take 1 tablet (80 mg total) by mouth daily.   Cholecalciferol (D3-1000 PO) Take 2,000 Units by mouth daily.   clopidogrel (PLAVIX) 75 MG tablet Take 1 tablet (75 mg total) by mouth daily.   fenofibrate (TRICOR) 145 MG tablet Take 145 mg by mouth daily.   furosemide (LASIX) 20 MG tablet Take 20 mg by mouth every other day as needed for edema or fluid.   insulin glargine (LANTUS) 100 UNIT/ML injection Inject 20 Units into the skin at bedtime.   insulin lispro (HUMALOG) 100 UNIT/ML KwikPen Inject 5 Units into the skin 2 (two) times daily.   isosorbide mononitrate (IMDUR) 60 MG 24 hr tablet Take 1 tablet (60 mg total) by mouth daily.   LANTUS SOLOSTAR 100 UNIT/ML Solostar Pen Inject 23 Units into the skin at bedtime.   levothyroxine (SYNTHROID) 88 MCG tablet Take 88 mcg by mouth daily.   lisinopril (ZESTRIL) 10 MG tablet Take 20 mg by mouth daily.   metoprolol tartrate (LOPRESSOR) 25 MG tablet Take 0.5 tablets (12.5 mg total) by mouth 2 (two) times daily.   omega-3 acid ethyl  esters (LOVAZA) 1 g capsule Take 2 capsules by mouth 2 (two) times daily.   omeprazole (PRILOSEC) 20 MG capsule Take 20 mg by mouth 2 (two) times daily before a meal.   [DISCONTINUED] nitroGLYCERIN (NITROSTAT) 0.4 MG SL tablet Place 1 tablet (0.4 mg total) under the tongue every 5 (five) minutes as needed for chest pain.     Allergies:   Patient has no known allergies.   Social History   Socioeconomic History   Marital status: Widowed    Spouse name: Not on file   Number of children: Not on file   Years of education: Not on file   Highest education level: Not on file  Occupational History   Not on file  Tobacco Use   Smoking status: Former    Types: Cigarettes   Smokeless tobacco: Never  Substance and Sexual Activity   Alcohol use: Not on file    Drug use: Not on file   Sexual activity: Not on file  Other Topics Concern   Not on file  Social History Narrative   Not on file   Social Determinants of Health   Financial Resource Strain: Not on file  Food Insecurity: Not on file  Transportation Needs: Not on file  Physical Activity: Not on file  Stress: Not on file  Social Connections: Not on file     Family History: The patient's family history includes Colon cancer in his mother. ROS:   Please see the history of present illness.    All 14 point review of systems negative except as described per history of present illness  EKGs/Labs/Other Studies Reviewed:         Recent Labs: No results found for requested labs within last 365 days.  Recent Lipid Panel No results found for: "CHOL", "TRIG", "HDL", "CHOLHDL", "VLDL", "LDLCALC", "LDLDIRECT"  Physical Exam:    VS:  BP (!) 150/94 (BP Location: Left Arm, Patient Position: Sitting)   Pulse 82   Ht 5\' 9"  (1.753 m)   Wt 185 lb 9.6 oz (84.2 kg)   SpO2 97%   BMI 27.41 kg/m     Wt Readings from Last 3 Encounters:  06/18/23 185 lb 9.6 oz (84.2 kg)  01/16/23 189 lb 9.6 oz (86 kg)  10/17/22 192 lb (87.1 kg)     GEN:  Well nourished, well developed in no acute distress HEENT: Normal NECK: No JVD; No carotid bruits LYMPHATICS: No lymphadenopathy CARDIAC: RRR, no murmurs, no rubs, no gallops RESPIRATORY:  Clear to auscultation without rales, wheezing or rhonchi  ABDOMEN: Soft, non-tender, non-distended MUSCULOSKELETAL:  No edema; No deformity  SKIN: Warm and dry LOWER EXTREMITIES: no swelling NEUROLOGIC:  Alert and oriented x 3 PSYCHIATRIC:  Normal affect   ASSESSMENT:    1. Coronary artery disease of native artery of native heart with stable angina pectoris (HCC)   2. Essential hypertension   3. Diabetes mellitus with stage 3 chronic kidney disease (HCC)   4. Status post coronary artery bypass graft 1995 [place]    PLAN:    In order of problems listed  above:  Coronary artery disease seems to be stable stable angina pectoris.  Continue present management. Essential hypertension: Blood pressure slightly elevated today but he said he checked it at home always good. Dizziness new finding.  I will do EKG I will put a monitor for 2 weeks to make sure he does not have any significant bradycardia. Status post coronary bypass graft.  Noted   Medication Adjustments/Labs  and Tests Ordered: Current medicines are reviewed at length with the patient today.  Concerns regarding medicines are outlined above.  No orders of the defined types were placed in this encounter.  Medication changes: No orders of the defined types were placed in this encounter.   Signed, Georgeanna Lea, MD, [facility] 06/18/2023 11:15 AM    [facility]

## 2023-06-22 DIAGNOSIS — L570 Actinic keratosis: Secondary | ICD-10-CM | POA: Diagnosis not present

## 2023-06-22 DIAGNOSIS — L578 Other skin changes due to chronic exposure to nonionizing radiation: Secondary | ICD-10-CM | POA: Diagnosis not present

## 2023-06-22 DIAGNOSIS — L821 Other seborrheic keratosis: Secondary | ICD-10-CM | POA: Diagnosis not present

## 2023-06-25 DIAGNOSIS — E785 Hyperlipidemia, unspecified: Secondary | ICD-10-CM | POA: Diagnosis not present

## 2023-06-25 DIAGNOSIS — I110 Hypertensive heart disease with heart failure: Secondary | ICD-10-CM | POA: Diagnosis not present

## 2023-06-25 DIAGNOSIS — E890 Postprocedural hypothyroidism: Secondary | ICD-10-CM | POA: Diagnosis not present

## 2023-06-25 DIAGNOSIS — E1122 Type 2 diabetes mellitus with diabetic chronic kidney disease: Secondary | ICD-10-CM | POA: Diagnosis not present

## 2023-06-25 DIAGNOSIS — R001 Bradycardia, unspecified: Secondary | ICD-10-CM | POA: Diagnosis not present

## 2023-06-25 DIAGNOSIS — N183 Chronic kidney disease, stage 3 unspecified: Secondary | ICD-10-CM | POA: Diagnosis not present

## 2023-06-25 DIAGNOSIS — I25118 Atherosclerotic heart disease of native coronary artery with other forms of angina pectoris: Secondary | ICD-10-CM | POA: Diagnosis not present

## 2023-06-25 DIAGNOSIS — Z23 Encounter for immunization: Secondary | ICD-10-CM | POA: Diagnosis not present

## 2023-06-25 DIAGNOSIS — N1832 Chronic kidney disease, stage 3b: Secondary | ICD-10-CM | POA: Diagnosis not present

## 2023-07-01 DIAGNOSIS — R002 Palpitations: Secondary | ICD-10-CM | POA: Diagnosis not present

## 2023-07-29 DIAGNOSIS — N1832 Chronic kidney disease, stage 3b: Secondary | ICD-10-CM | POA: Diagnosis not present

## 2023-07-29 DIAGNOSIS — I2510 Atherosclerotic heart disease of native coronary artery without angina pectoris: Secondary | ICD-10-CM | POA: Diagnosis not present

## 2023-07-29 DIAGNOSIS — D631 Anemia in chronic kidney disease: Secondary | ICD-10-CM | POA: Diagnosis not present

## 2023-07-29 DIAGNOSIS — I129 Hypertensive chronic kidney disease with stage 1 through stage 4 chronic kidney disease, or unspecified chronic kidney disease: Secondary | ICD-10-CM | POA: Diagnosis not present

## 2023-07-29 DIAGNOSIS — E1022 Type 1 diabetes mellitus with diabetic chronic kidney disease: Secondary | ICD-10-CM | POA: Diagnosis not present

## 2023-08-11 DIAGNOSIS — H5231 Anisometropia: Secondary | ICD-10-CM | POA: Diagnosis not present

## 2023-08-11 DIAGNOSIS — H40013 Open angle with borderline findings, low risk, bilateral: Secondary | ICD-10-CM | POA: Diagnosis not present

## 2023-08-11 DIAGNOSIS — Z9849 Cataract extraction status, unspecified eye: Secondary | ICD-10-CM | POA: Diagnosis not present

## 2023-08-11 DIAGNOSIS — E119 Type 2 diabetes mellitus without complications: Secondary | ICD-10-CM | POA: Diagnosis not present

## 2023-08-11 DIAGNOSIS — H47233 Glaucomatous optic atrophy, bilateral: Secondary | ICD-10-CM | POA: Diagnosis not present

## 2023-08-11 DIAGNOSIS — H52223 Regular astigmatism, bilateral: Secondary | ICD-10-CM | POA: Diagnosis not present

## 2023-08-11 DIAGNOSIS — Z7984 Long term (current) use of oral hypoglycemic drugs: Secondary | ICD-10-CM | POA: Diagnosis not present

## 2023-08-11 DIAGNOSIS — Z961 Presence of intraocular lens: Secondary | ICD-10-CM | POA: Diagnosis not present

## 2023-08-11 DIAGNOSIS — H524 Presbyopia: Secondary | ICD-10-CM | POA: Diagnosis not present

## 2023-08-15 ENCOUNTER — Other Ambulatory Visit: Payer: Self-pay | Admitting: Cardiology

## 2023-09-21 ENCOUNTER — Ambulatory Visit: Payer: Medicare PPO | Attending: Cardiology | Admitting: Cardiology

## 2023-09-21 ENCOUNTER — Encounter: Payer: Self-pay | Admitting: Cardiology

## 2023-09-21 VITALS — BP 122/70 | HR 54 | Ht 69.0 in | Wt 189.2 lb

## 2023-09-21 DIAGNOSIS — I10 Essential (primary) hypertension: Secondary | ICD-10-CM | POA: Diagnosis not present

## 2023-09-21 DIAGNOSIS — I25118 Atherosclerotic heart disease of native coronary artery with other forms of angina pectoris: Secondary | ICD-10-CM

## 2023-09-21 DIAGNOSIS — E785 Hyperlipidemia, unspecified: Secondary | ICD-10-CM

## 2023-09-21 MED ORDER — ISOSORBIDE MONONITRATE ER 120 MG PO TB24: 120.0000 mg | ORAL_TABLET | Freq: Every day | ORAL | 3 refills | Status: DC

## 2023-09-21 NOTE — Addendum Note (Signed)
Addended by: Baldo Ash D on: 09/21/2023 02:55 PM   Modules accepted: Orders

## 2023-09-21 NOTE — Progress Notes (Signed)
Cardiology Office Note:    Date:  09/21/2023   ID:  Joshua Morrison, DOB 11/06/1940, MRN [id number]  PCP:  Street, Stephanie Coup, MD  Cardiologist:  Gypsy Balsam, MD    Referring MD: Street, Stephanie Coup, *   Chief Complaint  Patient presents with   Follow-up    History of Present Illness:    Joshua Morrison is a 82 y.o. male past medical history significant for coronary bypass graft done at [facility] in 1995 also essential hypertension, dyslipidemia, diabetes.  Comes today to months for follow-up.  Recently started complain of having chest pain stress test being done showed some small area of ischemia however because of kidney dysfunction with creatinine of 1.9 we elected to try medical therapy which seems to be working quite well he said he does what he wants to do he cannot push himself hard and he will try to push himself or do will develop chest pain.  But otherwise doing fine.  Also recently he wore a monitor which showed some runs of nonsustained ventricular tachycardia but he is being asymptomatic since that time with preserved ejection fraction.  Past Medical History:  Diagnosis Date   Acquired hypothyroidism    Atherosclerosis of native coronary artery of native heart with stable angina pectoris (HCC)    Blood type B+    BMI 27.0-27.9,adult    Diabetes mellitus with stage 3 chronic kidney disease (HCC)    Dyslipidemia    Dyspnea on exertion    Hypertensive heart disease with heart failure (HCC)    Overweight    Recurrent major depression in remission (HCC)    Reflux gastritis    Senile osteopenia    Stage 3b chronic kidney disease (CKD) (HCC)    Vitamin D deficiency     Past Surgical History:  Procedure Laterality Date   CATARACT EXTRACTION, BILATERAL  01/2016   CHOLECYSTECTOMY  2014   CORONARY ARTERY BYPASS GRAFT  1995   TONSILLECTOMY      Current Medications: Current Meds  Medication Sig   aspirin EC 81 MG tablet Take 81 mg by mouth daily.    atorvastatin (LIPITOR) 80 MG tablet TAKE 1 TABLET(80 MG) BY MOUTH DAILY (Patient taking differently: Take 80 mg by mouth daily.)   Cholecalciferol (D3-1000 PO) Take 2,000 Units by mouth daily.   clopidogrel (PLAVIX) 75 MG tablet TAKE 1 TABLET(75 MG) BY MOUTH DAILY (Patient taking differently: Take 75 mg by mouth daily.)   fenofibrate (TRICOR) 145 MG tablet Take 145 mg by mouth daily.   furosemide (LASIX) 20 MG tablet Take 20 mg by mouth every other day as needed for edema or fluid.   insulin glargine (LANTUS) 100 UNIT/ML injection Inject 20 Units into the skin at bedtime.   insulin lispro (HUMALOG) 100 UNIT/ML KwikPen Inject 5 Units into the skin 2 (two) times daily.   isosorbide mononitrate (IMDUR) 60 MG 24 hr tablet Take 1 tablet (60 mg total) by mouth daily.   levothyroxine (SYNTHROID) 88 MCG tablet Take 88 mcg by mouth daily.   lisinopril (ZESTRIL) 10 MG tablet Take 20 mg by mouth daily.   metoprolol tartrate (LOPRESSOR) 25 MG tablet Take 0.5 tablets (12.5 mg total) by mouth 2 (two) times daily.   omega-3 acid ethyl esters (LOVAZA) 1 g capsule Take 2 capsules by mouth 2 (two) times daily.   omeprazole (PRILOSEC) 20 MG capsule Take 20 mg by mouth 2 (two) times daily before a meal.   [DISCONTINUED] LANTUS SOLOSTAR 100 UNIT/ML  Solostar Pen Inject 23 Units into the skin at bedtime.     Allergies:   Patient has no known allergies.   Social History   Socioeconomic History   Marital status: Widowed    Spouse name: Not on file   Number of children: Not on file   Years of education: Not on file   Highest education level: Not on file  Occupational History   Not on file  Tobacco Use   Smoking status: Former    Types: Cigarettes   Smokeless tobacco: Never  Substance and Sexual Activity   Alcohol use: Not on file   Drug use: Not on file   Sexual activity: Not on file  Other Topics Concern   Not on file  Social History Narrative   Not on file   Social Drivers of Health   Financial  Resource Strain: Not on file  Food Insecurity: Not on file  Transportation Needs: Not on file  Physical Activity: Not on file  Stress: Not on file  Social Connections: Not on file     Family History: The patient's family history includes Colon cancer in his mother. ROS:   Please see the history of present illness.    All 14 point review of systems negative except as described per history of present illness  EKGs/Labs/Other Studies Reviewed:         Recent Labs: No results found for requested labs within last 365 days.  Recent Lipid Panel No results found for: "CHOL", "TRIG", "HDL", "CHOLHDL", "VLDL", "LDLCALC", "LDLDIRECT"  Physical Exam:    VS:  BP 122/70 (BP Location: Left Arm, Patient Position: Sitting)   Pulse (!) 54   Ht 5\' 9"  (1.753 m)   Wt 189 lb 3.2 oz (85.8 kg)   SpO2 98%   BMI 27.94 kg/m     Wt Readings from Last 3 Encounters:  09/21/23 189 lb 3.2 oz (85.8 kg)  06/18/23 185 lb 9.6 oz (84.2 kg)  01/16/23 189 lb 9.6 oz (86 kg)     GEN:  Well nourished, well developed in no acute distress HEENT: Normal NECK: No JVD; No carotid bruits LYMPHATICS: No lymphadenopathy CARDIAC: RRR, no murmurs, no rubs, no gallops RESPIRATORY:  Clear to auscultation without rales, wheezing or rhonchi  ABDOMEN: Soft, non-tender, non-distended MUSCULOSKELETAL:  No edema; No deformity  SKIN: Warm and dry LOWER EXTREMITIES: no swelling NEUROLOGIC:  Alert and oriented x 3 PSYCHIATRIC:  Normal affect   ASSESSMENT:    1. Coronary artery disease of native artery of native heart with stable angina pectoris (HCC)   2. Essential hypertension   3. Dyslipidemia    PLAN:    In order of problems listed above:  Coronary disease stable from that point review still have some rare episode of chest pain.  I will schedule him to have higher dose of Imdur we will go to 120 from 60 mg daily. Essential hypertension blood pressure well-controlled continue present management. Dyslipidemia I  did review K PN which show LDL 62 HDL 15 only.  Will continue present management   Medication Adjustments/Labs and Tests Ordered: Current medicines are reviewed at length with the patient today.  Concerns regarding medicines are outlined above.  No orders of the defined types were placed in this encounter.  Medication changes: No orders of the defined types were placed in this encounter.   Signed, Georgeanna Lea, MD, [facility] 09/21/2023 2:33 PM    [facility]

## 2023-09-21 NOTE — Patient Instructions (Signed)
Medication Instructions:   INCREASE: Imdur to 120mg  daily   Lab Work: None Ordered If you have labs (blood work) drawn today and your tests are completely normal, you will receive your results only by: MyChart Message (if you have MyChart) OR A paper copy in the mail If you have any lab test that is abnormal or we need to change your treatment, we will call you to review the results.   Testing/Procedures: None Ordered   Follow-Up: At [facility], you and your health needs are our priority.  As part of our continuing mission to provide you with exceptional heart care, we have created designated Provider Care Teams.  These Care Teams include your primary Cardiologist (physician) and Advanced Practice Providers (APPs -  Physician Assistants and Nurse Practitioners) who all work together to provide you with the care you need, when you need it.  We recommend signing up for the patient portal called "MyChart".  Sign up information is provided on this After Visit Summary.  MyChart is used to connect with patients for Virtual Visits (Telemedicine).  Patients are able to view lab/test results, encounter notes, upcoming appointments, etc.  Non-urgent messages can be sent to your provider as well.   To learn more about what you can do with MyChart, go to [email or web address].    Your next appointment:   6 month(s)  The format for your next appointment:   In Person  Provider:   Gypsy Balsam, MD    Other Instructions NA

## 2023-10-07 ENCOUNTER — Other Ambulatory Visit: Payer: Self-pay | Admitting: Cardiology

## 2023-10-26 DIAGNOSIS — E109 Type 1 diabetes mellitus without complications: Secondary | ICD-10-CM | POA: Diagnosis not present

## 2023-10-26 DIAGNOSIS — R809 Proteinuria, unspecified: Secondary | ICD-10-CM | POA: Diagnosis not present

## 2023-10-26 DIAGNOSIS — E7800 Pure hypercholesterolemia, unspecified: Secondary | ICD-10-CM | POA: Diagnosis not present

## 2023-10-26 DIAGNOSIS — E890 Postprocedural hypothyroidism: Secondary | ICD-10-CM | POA: Diagnosis not present

## 2023-10-26 DIAGNOSIS — N189 Chronic kidney disease, unspecified: Secondary | ICD-10-CM | POA: Diagnosis not present

## 2023-10-26 DIAGNOSIS — Z8679 Personal history of other diseases of the circulatory system: Secondary | ICD-10-CM | POA: Diagnosis not present

## 2023-10-26 DIAGNOSIS — I10 Essential (primary) hypertension: Secondary | ICD-10-CM | POA: Diagnosis not present

## 2023-11-07 DIAGNOSIS — I10 Essential (primary) hypertension: Secondary | ICD-10-CM | POA: Diagnosis not present

## 2023-11-07 DIAGNOSIS — R040 Epistaxis: Secondary | ICD-10-CM | POA: Diagnosis not present

## 2023-11-07 DIAGNOSIS — R58 Hemorrhage, not elsewhere classified: Secondary | ICD-10-CM | POA: Diagnosis not present

## 2023-11-08 ENCOUNTER — Encounter: Payer: Self-pay | Admitting: Cardiology

## 2023-11-09 MED ORDER — LISINOPRIL 40 MG PO TABS: 40.0000 mg | ORAL_TABLET | Freq: Every day | ORAL | 3 refills | Status: DC

## 2023-11-12 ENCOUNTER — Telehealth: Payer: Self-pay

## 2023-11-12 DIAGNOSIS — Z7901 Long term (current) use of anticoagulants: Secondary | ICD-10-CM | POA: Diagnosis not present

## 2023-11-12 DIAGNOSIS — I10 Essential (primary) hypertension: Secondary | ICD-10-CM | POA: Diagnosis not present

## 2023-11-12 DIAGNOSIS — J310 Chronic rhinitis: Secondary | ICD-10-CM | POA: Diagnosis not present

## 2023-11-12 DIAGNOSIS — R040 Epistaxis: Secondary | ICD-10-CM | POA: Diagnosis not present

## 2023-11-12 NOTE — Telephone Encounter (Signed)
 BMP ordered per Pt advice notes to get baseline BMP with the changes in BP medications.

## 2023-11-13 LAB — BASIC METABOLIC PANEL
BUN/Creatinine Ratio: 12 (ref 10–24)
BUN: 28 mg/dL — ABNORMAL HIGH (ref 8–27)
CO2: 21 mmol/L (ref 20–29)
Calcium: 9.7 mg/dL (ref 8.6–10.2)
Chloride: 100 mmol/L (ref 96–106)
Creatinine, Ser: 2.25 mg/dL — ABNORMAL HIGH (ref 0.76–1.27)
Glucose: 137 mg/dL — ABNORMAL HIGH (ref 70–99)
Potassium: 4.3 mmol/L (ref 3.5–5.2)
Sodium: 135 mmol/L (ref 134–144)
eGFR: 29 mL/min/{1.73_m2} — ABNORMAL LOW (ref >59)

## 2023-11-16 DIAGNOSIS — H47233 Glaucomatous optic atrophy, bilateral: Secondary | ICD-10-CM | POA: Diagnosis not present

## 2023-11-16 DIAGNOSIS — H40023 Open angle with borderline findings, high risk, bilateral: Secondary | ICD-10-CM | POA: Diagnosis not present

## 2023-11-18 ENCOUNTER — Telehealth: Payer: Self-pay

## 2023-11-18 DIAGNOSIS — I10 Essential (primary) hypertension: Secondary | ICD-10-CM

## 2023-11-18 MED ORDER — AMLODIPINE BESYLATE 2.5 MG PO TABS: 2.5000 mg | ORAL_TABLET | Freq: Every day | ORAL | 3 refills | Status: DC

## 2023-11-18 NOTE — Telephone Encounter (Signed)
Left message on My Chart with lab results per Dr. Vanetta Shawl note. Routed to PCP.

## 2023-11-18 NOTE — Addendum Note (Signed)
Addended by: Baldo Ash D on: 11/18/2023 01:04 PM   Modules accepted: Orders

## 2023-11-20 ENCOUNTER — Telehealth: Payer: Self-pay

## 2023-11-20 DIAGNOSIS — E109 Type 1 diabetes mellitus without complications: Secondary | ICD-10-CM | POA: Diagnosis not present

## 2023-11-20 DIAGNOSIS — I10 Essential (primary) hypertension: Secondary | ICD-10-CM | POA: Diagnosis not present

## 2023-11-20 DIAGNOSIS — E890 Postprocedural hypothyroidism: Secondary | ICD-10-CM | POA: Diagnosis not present

## 2023-11-20 MED ORDER — AMLODIPINE BESYLATE 2.5 MG PO TABS: 5.0000 mg | ORAL_TABLET | Freq: Every day | ORAL | 3 refills | Status: DC

## 2023-11-20 NOTE — Telephone Encounter (Signed)
Spoke with pt per Dr. Vanetta Shawl note. Stop Metoprolo and increase Amlodipine to 5mg  daily

## 2023-11-20 NOTE — Telephone Encounter (Signed)
Pt viewed lab results on My Chart per Dr. Vanetta Shawl note. Routed to PCP.

## 2023-11-21 LAB — BASIC METABOLIC PANEL
BUN/Creatinine Ratio: 12 (ref 10–24)
BUN: 22 mg/dL (ref 8–27)
CO2: 19 mmol/L — ABNORMAL LOW (ref 20–29)
Calcium: 9.3 mg/dL (ref 8.6–10.2)
Chloride: 103 mmol/L (ref 96–106)
Creatinine, Ser: 1.87 mg/dL — ABNORMAL HIGH (ref 0.76–1.27)
Glucose: 148 mg/dL — ABNORMAL HIGH (ref 70–99)
Potassium: 4.1 mmol/L (ref 3.5–5.2)
Sodium: 136 mmol/L (ref 134–144)
eGFR: 36 mL/min/{1.73_m2} — ABNORMAL LOW (ref >59)

## 2023-11-25 ENCOUNTER — Telehealth: Payer: Self-pay

## 2023-11-25 NOTE — Telephone Encounter (Signed)
 Left message on My Chart with normal results per Dr. Vanetta Shawl note. Routed to PCP.

## 2023-11-27 ENCOUNTER — Telehealth: Payer: Self-pay

## 2023-11-27 NOTE — Telephone Encounter (Signed)
 Patient notified of results and verbalized understanding.

## 2023-11-27 NOTE — Telephone Encounter (Signed)
-----   Message from Gypsy Balsam sent at 11/23/2023  8:56 AM EST ----- Chem-7 show improvement in kidney function, continue present management

## 2023-12-08 DIAGNOSIS — F3340 Major depressive disorder, recurrent, in remission, unspecified: Secondary | ICD-10-CM | POA: Diagnosis not present

## 2023-12-08 DIAGNOSIS — Z0000 Encounter for general adult medical examination without abnormal findings: Secondary | ICD-10-CM | POA: Diagnosis not present

## 2023-12-08 DIAGNOSIS — E785 Hyperlipidemia, unspecified: Secondary | ICD-10-CM | POA: Diagnosis not present

## 2023-12-08 DIAGNOSIS — R001 Bradycardia, unspecified: Secondary | ICD-10-CM | POA: Diagnosis not present

## 2023-12-08 DIAGNOSIS — N183 Chronic kidney disease, stage 3 unspecified: Secondary | ICD-10-CM | POA: Diagnosis not present

## 2023-12-08 DIAGNOSIS — I110 Hypertensive heart disease with heart failure: Secondary | ICD-10-CM | POA: Diagnosis not present

## 2023-12-08 DIAGNOSIS — I25118 Atherosclerotic heart disease of native coronary artery with other forms of angina pectoris: Secondary | ICD-10-CM | POA: Diagnosis not present

## 2023-12-08 DIAGNOSIS — N1832 Chronic kidney disease, stage 3b: Secondary | ICD-10-CM | POA: Diagnosis not present

## 2023-12-08 DIAGNOSIS — E1122 Type 2 diabetes mellitus with diabetic chronic kidney disease: Secondary | ICD-10-CM | POA: Diagnosis not present

## 2023-12-08 LAB — LAB REPORT - SCANNED
Albumin, Urine POC: 221.4
Albumin/Creatinine Ratio, Urine, POC: 356
Creatinine, POC: 62.2 mg/dL
EGFR: 40
Free T4: 1.37 ng/dL
TSH: 0.816

## 2023-12-28 ENCOUNTER — Other Ambulatory Visit: Payer: Self-pay | Admitting: Cardiology

## 2024-01-07 MED ORDER — AMLODIPINE BESYLATE 10 MG PO TABS: 10.0000 mg | ORAL_TABLET | Freq: Every day | ORAL | 3 refills | Status: AC

## 2024-01-07 MED ORDER — FUROSEMIDE 20 MG PO TABS: 20.0000 mg | ORAL_TABLET | ORAL | 3 refills | Status: AC | PRN

## 2024-01-07 NOTE — Addendum Note (Signed)
 Addended by: Eleonore Chiquito on: 01/07/2024 08:49 AM   Modules accepted: Orders

## 2024-01-21 DIAGNOSIS — L578 Other skin changes due to chronic exposure to nonionizing radiation: Secondary | ICD-10-CM | POA: Diagnosis not present

## 2024-01-21 DIAGNOSIS — L821 Other seborrheic keratosis: Secondary | ICD-10-CM | POA: Diagnosis not present

## 2024-01-21 DIAGNOSIS — L570 Actinic keratosis: Secondary | ICD-10-CM | POA: Diagnosis not present

## 2024-01-27 DIAGNOSIS — D631 Anemia in chronic kidney disease: Secondary | ICD-10-CM | POA: Diagnosis not present

## 2024-01-27 DIAGNOSIS — N189 Chronic kidney disease, unspecified: Secondary | ICD-10-CM | POA: Diagnosis not present

## 2024-01-27 DIAGNOSIS — N1832 Chronic kidney disease, stage 3b: Secondary | ICD-10-CM | POA: Diagnosis not present

## 2024-01-27 DIAGNOSIS — E1022 Type 1 diabetes mellitus with diabetic chronic kidney disease: Secondary | ICD-10-CM | POA: Diagnosis not present

## 2024-01-27 DIAGNOSIS — I129 Hypertensive chronic kidney disease with stage 1 through stage 4 chronic kidney disease, or unspecified chronic kidney disease: Secondary | ICD-10-CM | POA: Diagnosis not present

## 2024-04-12 ENCOUNTER — Encounter: Payer: Self-pay | Admitting: Cardiology

## 2024-04-12 ENCOUNTER — Ambulatory Visit: Attending: Cardiology | Admitting: Cardiology

## 2024-04-12 VITALS — BP 132/68 | HR 52 | Ht 69.0 in | Wt 193.0 lb

## 2024-04-12 DIAGNOSIS — E1122 Type 2 diabetes mellitus with diabetic chronic kidney disease: Secondary | ICD-10-CM | POA: Diagnosis not present

## 2024-04-12 DIAGNOSIS — R0609 Other forms of dyspnea: Secondary | ICD-10-CM | POA: Diagnosis not present

## 2024-04-12 DIAGNOSIS — Z951 Presence of aortocoronary bypass graft: Secondary | ICD-10-CM | POA: Diagnosis not present

## 2024-04-12 DIAGNOSIS — E785 Hyperlipidemia, unspecified: Secondary | ICD-10-CM

## 2024-04-12 DIAGNOSIS — N183 Chronic kidney disease, stage 3 unspecified: Secondary | ICD-10-CM

## 2024-04-12 DIAGNOSIS — I110 Hypertensive heart disease with heart failure: Secondary | ICD-10-CM | POA: Diagnosis not present

## 2024-04-12 DIAGNOSIS — I25118 Atherosclerotic heart disease of native coronary artery with other forms of angina pectoris: Secondary | ICD-10-CM

## 2024-04-12 NOTE — Addendum Note (Signed)
 Addended by: ARLOA PLANAS D on: 04/12/2024 01:53 PM   Modules accepted: Orders

## 2024-04-12 NOTE — Patient Instructions (Signed)
 Medication Instructions:  Your physician recommends that you continue on your current medications as directed. Please refer to the Current Medication list given to you today.  *If you need a refill on your cardiac medications before your next appointment, please call your pharmacy*   Lab Work: None Ordered If you have labs (blood work) drawn today and your tests are completely normal, you will receive your results only by: MyChart Message (if you have MyChart) OR A paper copy in the mail If you have any lab test that is abnormal or we need to change your treatment, we will call you to review the results.   Testing/Procedures: Your physician has requested that you have an echocardiogram. Echocardiography is a painless test that uses sound waves to create images of your heart. It provides your doctor with information about the size and shape of your heart and how well your heart's chambers and valves are working. This procedure takes approximately one hour. There are no restrictions for this procedure. Please do NOT wear cologne, perfume, aftershave, or lotions (deodorant is allowed). Please arrive 15 minutes prior to your appointment time.  Please note: We ask at that you not bring children with you during ultrasound (echo/ vascular) testing. Due to room size and safety concerns, children are not allowed in the ultrasound rooms during exams. Our front office staff cannot provide observation of children in our lobby area while testing is being conducted. An adult accompanying a patient to their appointment will only be allowed in the ultrasound room at the discretion of the ultrasound technician under special circumstances. We apologize for any inconvenience.    Follow-Up: At [facility], you and your health needs are our priority.  As part of our continuing mission to provide you with exceptional heart care, we have created designated Provider Care Teams.  These Care Teams include your  primary Cardiologist (physician) and Advanced Practice Providers (APPs -  Physician Assistants and Nurse Practitioners) who all work together to provide you with the care you need, when you need it.  We recommend signing up for the patient portal called "MyChart".  Sign up information is provided on this After Visit Summary.  MyChart is used to connect with patients for Virtual Visits (Telemedicine).  Patients are able to view lab/test results, encounter notes, upcoming appointments, etc.  Non-urgent messages can be sent to your provider as well.   To learn more about what you can do with MyChart, go to [email or web address].    Your next appointment:   3 month(s)  The format for your next appointment:   In Person  Provider:   Gypsy Balsam, MD    Other Instructions NA

## 2024-04-12 NOTE — Progress Notes (Signed)
 Cardiology Office Note:    Date:  04/12/2024   ID:  Joshua Morrison, DOB 1941/02/02, MRN [id number]  PCP:  Street, Lonni HERO, MD  Cardiologist:  Lamar Fitch, MD    Referring MD: Street, Lonni HERO, *   Chief Complaint  Patient presents with   Blood Pressure Check    History of Present Illness:    Joshua Morrison is a 83 y.o. male past medical history significant for coronary bypass grafting in 1995 at [facility], essential hypertension, dyslipidemia, diabetes.  Comes today to months for follow-up a while ago he did have a stress test done which showed small area of ischemia we initiated medical therapy with Improvement pain is completely gone he comes today for follow-up but he complained of weakness fatigue tiredness he said he is not able to do as much as he was able to do before.  Denies having atypical chest pain tightness squeezing pressure burning chest  Past Medical History:  Diagnosis Date   Acquired hypothyroidism    Atherosclerosis of native coronary artery of native heart with stable angina pectoris (HCC)    Blood type B+    BMI 27.0-27.9,adult    Diabetes mellitus with stage 3 chronic kidney disease (HCC)    Dyslipidemia    Dyspnea on exertion    Hypertensive heart disease with heart failure (HCC)    Overweight    Recurrent major depression in remission (HCC)    Reflux gastritis    Senile osteopenia    Stage 3b chronic kidney disease (CKD) (HCC)    Vitamin D deficiency     Past Surgical History:  Procedure Laterality Date   CATARACT EXTRACTION, BILATERAL  01/2016   CHOLECYSTECTOMY  2014   CORONARY ARTERY BYPASS GRAFT  1995   TONSILLECTOMY      Current Medications: Current Meds  Medication Sig   amLODipine  (NORVASC ) 10 MG tablet Take 1 tablet (10 mg total) by mouth daily.   aspirin EC 81 MG tablet Take 81 mg by mouth daily.   atorvastatin  (LIPITOR) 80 MG tablet TAKE 1 TABLET(80 MG) BY MOUTH DAILY (Patient taking differently: Take 80 mg by mouth  daily.)   Cholecalciferol (D3-1000 PO) Take 2,000 Units by mouth daily.   clopidogrel  (PLAVIX ) 75 MG tablet TAKE 1 TABLET(75 MG) BY MOUTH DAILY (Patient taking differently: Take 75 mg by mouth daily.)   fenofibrate (TRICOR) 145 MG tablet Take 145 mg by mouth daily.   furosemide  (LASIX ) 20 MG tablet Take 1 tablet (20 mg total) by mouth every other day as needed for edema or fluid.   insulin glargine (LANTUS) 100 UNIT/ML injection Inject 20 Units into the skin at bedtime.   insulin lispro (HUMALOG) 100 UNIT/ML KwikPen Inject 5 Units into the skin 2 (two) times daily.   isosorbide  mononitrate (IMDUR ) 120 MG 24 hr tablet Take 1 tablet (120 mg total) by mouth daily.   levothyroxine (SYNTHROID) 88 MCG tablet Take 88 mcg by mouth daily.   lisinopril  (ZESTRIL ) 40 MG tablet Take 1 tablet (40 mg total) by mouth daily.   omega-3 acid ethyl esters (LOVAZA) 1 g capsule Take 2 capsules by mouth 2 (two) times daily.   omeprazole (PRILOSEC) 20 MG capsule Take 20 mg by mouth 2 (two) times daily before a meal.     Allergies:   Patient has no known allergies.   Social History   Socioeconomic History   Marital status: Widowed    Spouse name: Not on file   Number of children:  Not on file   Years of education: Not on file   Highest education level: Not on file  Occupational History   Not on file  Tobacco Use   Smoking status: Former    Types: Cigarettes   Smokeless tobacco: Never  Substance and Sexual Activity   Alcohol use: Not on file   Drug use: Not on file   Sexual activity: Not on file  Other Topics Concern   Not on file  Social History Narrative   Not on file   Social Drivers of Health   Financial Resource Strain: Not on file  Food Insecurity: Not on file  Transportation Needs: Not on file  Physical Activity: Not on file  Stress: Not on file  Social Connections: Not on file     Family History: The patient's family history includes Colon cancer in his mother. ROS:   Please see the  history of present illness.    All 14 point review of systems negative except as described per history of present illness  EKGs/Labs/Other Studies Reviewed:         Recent Labs: 11/20/2023: BUN 22; Creatinine, Ser 1.87; Potassium 4.1; Sodium 136  Recent Lipid Panel No results found for: CHOL, TRIG, HDL, CHOLHDL, VLDL, LDLCALC, LDLDIRECT  Physical Exam:    VS:  BP 132/68 (BP Location: Right Arm, Patient Position: Sitting)   Pulse (!) 52   Ht 5' 9 (1.753 m)   Wt 193 lb (87.5 kg)   SpO2 97%   BMI 28.50 kg/m     Wt Readings from Last 3 Encounters:  04/12/24 193 lb (87.5 kg)  09/21/23 189 lb 3.2 oz (85.8 kg)  06/18/23 185 lb 9.6 oz (84.2 kg)     GEN:  Well nourished, well developed in no acute distress HEENT: Normal NECK: No JVD; No carotid bruits LYMPHATICS: No lymphadenopathy CARDIAC: RRR, no murmurs, no rubs, no gallops RESPIRATORY:  Clear to auscultation without rales, wheezing or rhonchi  ABDOMEN: Soft, non-tender, non-distended MUSCULOSKELETAL:  No edema; No deformity  SKIN: Warm and dry LOWER EXTREMITIES: no swelling NEUROLOGIC:  Alert and oriented x 3 PSYCHIATRIC:  Normal affect   ASSESSMENT:    1. Coronary artery disease of native artery of native heart with stable angina pectoris (HCC)   2. Hypertensive heart disease with heart failure (HCC)   3. Diabetes mellitus with stage 3 chronic kidney disease (HCC)   4. Dyslipidemia   5. Status post coronary artery bypass graft 1995 [place]    PLAN:    In order of problems listed above:  Coronary disease very atypical symptoms fatigue tiredness which could be related to ischemia however, what I want to do first is to get echocardiogram to assess left ventricle ejection fraction, if left ventricle ejection fraction will not show me any explanation for his symptomatology then we continue ischemia workup. Hypertension blood pressure well-controlled. Dyslipidemia I did review K PN which show me total  cholesterol 101 HDL 22 this is from March of this year continue present management. Status post coronary bypass graft noted   Medication Adjustments/Labs and Tests Ordered: Current medicines are reviewed at length with the patient today.  Concerns regarding medicines are outlined above.  No orders of the defined types were placed in this encounter.  Medication changes: No orders of the defined types were placed in this encounter.   Signed, Lamar DOROTHA Fitch, MD, [facility] 04/12/2024 1:47 PM    [facility]

## 2024-04-25 DIAGNOSIS — E109 Type 1 diabetes mellitus without complications: Secondary | ICD-10-CM | POA: Diagnosis not present

## 2024-04-25 DIAGNOSIS — E890 Postprocedural hypothyroidism: Secondary | ICD-10-CM | POA: Diagnosis not present

## 2024-04-28 ENCOUNTER — Ambulatory Visit: Attending: Cardiology

## 2024-04-28 DIAGNOSIS — R0609 Other forms of dyspnea: Secondary | ICD-10-CM

## 2024-04-29 LAB — ECHOCARDIOGRAM COMPLETE
AR max vel: 2.23 cm2
AV Area VTI: 2.1 cm2
AV Area mean vel: 2.17 cm2
AV Mean grad: 4.4 mmHg
AV Peak grad: 8.6 mmHg
Ao pk vel: 1.46 m/s
Area-P 1/2: 3.68 cm2
MV M vel: 5.75 m/s
MV Peak grad: 132.3 mmHg
Radius: 0.4 cm
S' Lateral: 3.7 cm

## 2024-05-02 DIAGNOSIS — E109 Type 1 diabetes mellitus without complications: Secondary | ICD-10-CM | POA: Diagnosis not present

## 2024-05-02 DIAGNOSIS — Z8679 Personal history of other diseases of the circulatory system: Secondary | ICD-10-CM | POA: Diagnosis not present

## 2024-05-02 DIAGNOSIS — R809 Proteinuria, unspecified: Secondary | ICD-10-CM | POA: Diagnosis not present

## 2024-05-02 DIAGNOSIS — E7800 Pure hypercholesterolemia, unspecified: Secondary | ICD-10-CM | POA: Diagnosis not present

## 2024-05-02 DIAGNOSIS — I10 Essential (primary) hypertension: Secondary | ICD-10-CM | POA: Diagnosis not present

## 2024-05-02 DIAGNOSIS — N189 Chronic kidney disease, unspecified: Secondary | ICD-10-CM | POA: Diagnosis not present

## 2024-05-02 DIAGNOSIS — E890 Postprocedural hypothyroidism: Secondary | ICD-10-CM | POA: Diagnosis not present

## 2024-05-04 ENCOUNTER — Ambulatory Visit: Attending: Cardiology | Admitting: Cardiology

## 2024-05-04 ENCOUNTER — Encounter: Payer: Self-pay | Admitting: Cardiology

## 2024-05-04 VITALS — BP 130/62 | HR 51 | Ht 69.0 in | Wt 192.8 lb

## 2024-05-04 DIAGNOSIS — N183 Chronic kidney disease, stage 3 unspecified: Secondary | ICD-10-CM | POA: Diagnosis not present

## 2024-05-04 DIAGNOSIS — Z951 Presence of aortocoronary bypass graft: Secondary | ICD-10-CM | POA: Diagnosis not present

## 2024-05-04 DIAGNOSIS — E785 Hyperlipidemia, unspecified: Secondary | ICD-10-CM | POA: Diagnosis not present

## 2024-05-04 DIAGNOSIS — I10 Essential (primary) hypertension: Secondary | ICD-10-CM

## 2024-05-04 DIAGNOSIS — E1122 Type 2 diabetes mellitus with diabetic chronic kidney disease: Secondary | ICD-10-CM | POA: Diagnosis not present

## 2024-05-04 MED ORDER — ISOSORBIDE MONONITRATE ER 60 MG PO TB24: 60.0000 mg | ORAL_TABLET | Freq: Every day | ORAL | 3 refills | Status: AC

## 2024-05-04 MED ORDER — ISOSORBIDE MONONITRATE ER 120 MG PO TB24: 120.0000 mg | ORAL_TABLET | Freq: Every day | ORAL | 3 refills | Status: DC

## 2024-05-04 NOTE — Patient Instructions (Addendum)
 Medication Instructions:   INCREASE: Imdur  to 180mg  daily -- Take Imdur  120mg  plus Imdur  60mg  daily--   Lab Work: None Ordered If you have labs (blood work) drawn today and your tests are completely normal, you will receive your results only by: MyChart Message (if you have MyChart) OR A paper copy in the mail If you have any lab test that is abnormal or we need to change your treatment, we will call you to review the results.   Testing/Procedures: None Ordered   Follow-Up: At [facility], you and your health needs are our priority.  As part of our continuing mission to provide you with exceptional heart care, we have created designated Provider Care Teams.  These Care Teams include your primary Cardiologist (physician) and Advanced Practice Providers (APPs -  Physician Assistants and Nurse Practitioners) who all work together to provide you with the care you need, when you need it.  We recommend signing up for the patient portal called MyChart.  Sign up information is provided on this After Visit Summary.  MyChart is used to connect with patients for Virtual Visits (Telemedicine).  Patients are able to view lab/test results, encounter notes, upcoming appointments, etc.  Non-urgent messages can be sent to your provider as well.   To learn more about what you can do with MyChart, go to [email or web address].    Your next appointment:   6 week(s)  The format for your next appointment:   In Person  Provider:   Lamar Fitch, MD    Other Instructions NA

## 2024-05-04 NOTE — Progress Notes (Unsigned)
 Cardiology Office Note:    Date:  05/04/2024   ID:  Joshua, Morrison 07/07/1941, MRN [id number]  PCP:  Street, Lonni HERO, MD  Cardiologist:  Lamar Fitch, MD    Referring MD: Street, Lonni HERO, *   Chief Complaint  Patient presents with   Results    History of Present Illness:    Joshua Morrison is a 83 y.o. male past medical history significant for coronary bypass grafting 1995 done on pulses:, Essential hypertension, dyslipidemia, diabetes.  Comes today to my office for follow-up.  Of a stress test done about 2 to 3 years ago which was abnormal he started having a little more angina pectoris but also does have kidney dysfunction so we will try to manage this medically.  He said he is not able to do as much as he was able to do before because of fatigue tiredness and some chest pain.  I did echocardiogram to check left ventricle ejection fraction, lower limits of normal but overall no explanation for his symptomatology from that aspect.  Past Medical History:  Diagnosis Date   Acquired hypothyroidism    Atherosclerosis of native coronary artery of native heart with stable angina pectoris (HCC)    Blood type B+    BMI 27.0-27.9,adult    Diabetes mellitus with stage 3 chronic kidney disease (HCC)    Dyslipidemia    Dyspnea on exertion    Hypertensive heart disease with heart failure (HCC)    Overweight    Recurrent major depression in remission (HCC)    Reflux gastritis    Senile osteopenia    Stage 3b chronic kidney disease (CKD) (HCC)    Vitamin D deficiency     Past Surgical History:  Procedure Laterality Date   CATARACT EXTRACTION, BILATERAL  01/2016   CHOLECYSTECTOMY  2014   CORONARY ARTERY BYPASS GRAFT  1995   TONSILLECTOMY      Current Medications: Current Meds  Medication Sig   amLODipine  (NORVASC ) 10 MG tablet Take 1 tablet (10 mg total) by mouth daily.   aspirin EC 81 MG tablet Take 81 mg by mouth daily.   atorvastatin  (LIPITOR) 80 MG tablet  TAKE 1 TABLET(80 MG) BY MOUTH DAILY (Patient taking differently: Take 80 mg by mouth daily.)   Cholecalciferol (D3-1000 PO) Take 2,000 Units by mouth daily.   clopidogrel  (PLAVIX ) 75 MG tablet TAKE 1 TABLET(75 MG) BY MOUTH DAILY (Patient taking differently: Take 75 mg by mouth daily.)   fenofibrate (TRICOR) 145 MG tablet Take 145 mg by mouth daily.   furosemide  (LASIX ) 20 MG tablet Take 1 tablet (20 mg total) by mouth every other day as needed for edema or fluid.   insulin glargine (LANTUS) 100 UNIT/ML injection Inject 20 Units into the skin at bedtime.   insulin lispro (HUMALOG) 100 UNIT/ML KwikPen Inject 5 Units into the skin 2 (two) times daily.   levothyroxine (SYNTHROID) 88 MCG tablet Take 88 mcg by mouth daily.   lisinopril  (ZESTRIL ) 40 MG tablet Take 1 tablet (40 mg total) by mouth daily.   omega-3 acid ethyl esters (LOVAZA) 1 g capsule Take 2 capsules by mouth 2 (two) times daily.   omeprazole (PRILOSEC) 20 MG capsule Take 20 mg by mouth 2 (two) times daily before a meal.   [DISCONTINUED] isosorbide  mononitrate (IMDUR ) 120 MG 24 hr tablet Take 1 tablet (120 mg total) by mouth daily.     Allergies:   Patient has no known allergies.   Social History  Socioeconomic History   Marital status: Widowed    Spouse name: Not on file   Number of children: Not on file   Years of education: Not on file   Highest education level: Not on file  Occupational History   Not on file  Tobacco Use   Smoking status: Former    Types: Cigarettes   Smokeless tobacco: Never  Substance and Sexual Activity   Alcohol use: Not on file   Drug use: Not on file   Sexual activity: Not on file  Other Topics Concern   Not on file  Social History Narrative   Not on file   Social Drivers of Health   Financial Resource Strain: Not on file  Food Insecurity: Not on file  Transportation Needs: Not on file  Physical Activity: Not on file  Stress: Not on file  Social Connections: Not on file     Family  History: The patient's family history includes Colon cancer in his mother. ROS:   Please see the history of present illness.    All 14 point review of systems negative except as described per history of present illness  EKGs/Labs/Other Studies Reviewed:         Recent Labs: 11/20/2023: BUN 22; Creatinine, Ser 1.87; Potassium 4.1; Sodium 136  Recent Lipid Panel No results found for: CHOL, TRIG, HDL, CHOLHDL, VLDL, LDLCALC, LDLDIRECT  Physical Exam:    VS:  BP 130/62 (BP Location: Left Arm, Patient Position: Sitting)   Pulse (!) 51   Ht 5' 9 (1.753 m)   Wt 192 lb 12.8 oz (87.5 kg)   SpO2 95%   BMI 28.47 kg/m     Wt Readings from Last 3 Encounters:  05/04/24 192 lb 12.8 oz (87.5 kg)  04/12/24 193 lb (87.5 kg)  09/21/23 189 lb 3.2 oz (85.8 kg)     GEN:  Well nourished, well developed in no acute distress HEENT: Normal NECK: No JVD; No carotid bruits LYMPHATICS: No lymphadenopathy CARDIAC: RRR, no murmurs, no rubs, no gallops RESPIRATORY:  Clear to auscultation without rales, wheezing or rhonchi  ABDOMEN: Soft, non-tender, non-distended MUSCULOSKELETAL:  No edema; No deformity  SKIN: Warm and dry LOWER EXTREMITIES: no swelling NEUROLOGIC:  Alert and oriented x 3 PSYCHIATRIC:  Normal affect   ASSESSMENT:    1. Status post coronary artery bypass graft 1995 [place]   2. Dyslipidemia   3. Diabetes mellitus with stage 3 chronic kidney disease (HCC)   4. Essential hypertension    PLAN:    In order of problems listed above:  Coronary disease.  He does have angina.  Medically for beta-blocker because of bradycardia, he is already on amlodipine  I will increase dose of Imdur  from 120-180 in the future we will consider adding ranolazine. Dyslipidemia.  I did review K PN which show me his LDL of 62 HDL 22.  He is taking Lipitor 80 which I will continue. Diabetes mellitus-being followed by internal medicine team last hemoglobin A1c from 04/25/2024 is 5.7  excellent control continue present management. Essential hypertension blood pressure well-controlled   Medication Adjustments/Labs and Tests Ordered: Current medicines are reviewed at length with the patient today.  Concerns regarding medicines are outlined above.  No orders of the defined types were placed in this encounter.  Medication changes: No orders of the defined types were placed in this encounter.   Signed, Lamar DOROTHA Fitch, MD, [facility] 05/04/2024 10:30 AM    [facility]

## 2024-05-05 ENCOUNTER — Ambulatory Visit: Payer: Self-pay | Admitting: Cardiology

## 2024-05-16 DIAGNOSIS — H40023 Open angle with borderline findings, high risk, bilateral: Secondary | ICD-10-CM | POA: Diagnosis not present

## 2024-05-16 DIAGNOSIS — H47233 Glaucomatous optic atrophy, bilateral: Secondary | ICD-10-CM | POA: Diagnosis not present

## 2024-05-17 ENCOUNTER — Other Ambulatory Visit: Payer: Self-pay | Admitting: Cardiology

## 2024-06-09 DIAGNOSIS — I110 Hypertensive heart disease with heart failure: Secondary | ICD-10-CM | POA: Diagnosis not present

## 2024-06-09 DIAGNOSIS — N183 Chronic kidney disease, stage 3 unspecified: Secondary | ICD-10-CM | POA: Diagnosis not present

## 2024-06-09 DIAGNOSIS — N1832 Chronic kidney disease, stage 3b: Secondary | ICD-10-CM | POA: Diagnosis not present

## 2024-06-09 DIAGNOSIS — R001 Bradycardia, unspecified: Secondary | ICD-10-CM | POA: Diagnosis not present

## 2024-06-09 DIAGNOSIS — I70213 Atherosclerosis of native arteries of extremities with intermittent claudication, bilateral legs: Secondary | ICD-10-CM | POA: Diagnosis not present

## 2024-06-09 DIAGNOSIS — E890 Postprocedural hypothyroidism: Secondary | ICD-10-CM | POA: Diagnosis not present

## 2024-06-09 DIAGNOSIS — I25118 Atherosclerotic heart disease of native coronary artery with other forms of angina pectoris: Secondary | ICD-10-CM | POA: Diagnosis not present

## 2024-06-09 DIAGNOSIS — E785 Hyperlipidemia, unspecified: Secondary | ICD-10-CM | POA: Diagnosis not present

## 2024-06-09 DIAGNOSIS — E1122 Type 2 diabetes mellitus with diabetic chronic kidney disease: Secondary | ICD-10-CM | POA: Diagnosis not present

## 2024-06-15 ENCOUNTER — Ambulatory Visit: Attending: Cardiology | Admitting: Cardiology

## 2024-06-15 ENCOUNTER — Encounter: Payer: Self-pay | Admitting: Cardiology

## 2024-06-15 VITALS — BP 126/70 | HR 57 | Ht 69.0 in | Wt 193.8 lb

## 2024-06-15 DIAGNOSIS — I25118 Atherosclerotic heart disease of native coronary artery with other forms of angina pectoris: Secondary | ICD-10-CM | POA: Diagnosis not present

## 2024-06-15 DIAGNOSIS — E1122 Type 2 diabetes mellitus with diabetic chronic kidney disease: Secondary | ICD-10-CM

## 2024-06-15 DIAGNOSIS — R0609 Other forms of dyspnea: Secondary | ICD-10-CM

## 2024-06-15 DIAGNOSIS — N183 Chronic kidney disease, stage 3 unspecified: Secondary | ICD-10-CM

## 2024-06-15 DIAGNOSIS — Z951 Presence of aortocoronary bypass graft: Secondary | ICD-10-CM | POA: Diagnosis not present

## 2024-06-15 DIAGNOSIS — E785 Hyperlipidemia, unspecified: Secondary | ICD-10-CM | POA: Diagnosis not present

## 2024-06-15 MED ORDER — RANOLAZINE ER 500 MG PO TB12: 500.0000 mg | ORAL_TABLET | Freq: Two times a day (BID) | ORAL | 3 refills | Status: DC

## 2024-06-15 NOTE — Progress Notes (Unsigned)
 Cardiology Office Note:    Date:  06/15/2024   ID:  Joshua Morrison, DOB 02-21-1941, MRN [id number]  PCP:  Street, Joshua HERO, MD  Cardiologist:  Lamar Fitch, MD    Referring MD: [address] Joshua Morrison, *   No chief complaint on file. Still have some episode of angina pectoris  History of Present Illness:    Joshua Morrison is a 83 y.o. male past medical history significant for coronary bypass graft done in 1995, essential hypertension, dyslipidemia, diabetes.  Comes today to my office for follow-up a few years ago he had a stress test done which was abnormal and initially was put on antianginal therapy with great response but now he started having a little more angina pectoris and tried to put him on medications with some success, concern is about potentially doing cardiac catheterization or his kidney dysfunction.  Therefore we will try to manage him medically  Past Medical History:  Diagnosis Date   Acquired hypothyroidism    Atherosclerosis of native coronary artery of native heart with stable angina pectoris (HCC)    Blood type B+    BMI 27.0-27.9,adult    Diabetes mellitus with stage 3 chronic kidney disease (HCC)    Dyslipidemia    Dyspnea on exertion    Essential hypertension 09/04/2022   Hypertensive heart disease with heart failure (HCC)    Overweight    Recurrent major depression in remission (HCC)    Reflux gastritis    Senile osteopenia    Stage 3b chronic kidney disease (CKD) (HCC)    Status post coronary artery bypass graft 1995 [place] 09/04/2022   Vitamin D deficiency     Past Surgical History:  Procedure Laterality Date   CATARACT EXTRACTION, BILATERAL  01/2016   CHOLECYSTECTOMY  2014   CORONARY ARTERY BYPASS GRAFT  1995   TONSILLECTOMY      Current Medications: Current Meds  Medication Sig   amLODipine  (NORVASC ) 10 MG tablet Take 1 tablet (10 mg total) by mouth daily.   aspirin EC 81 MG tablet Take 81 mg by mouth daily.   atorvastatin   (LIPITOR) 80 MG tablet TAKE 1 TABLET(80 MG) BY MOUTH DAILY   Cholecalciferol (D3-1000 PO) Take 2,000 Units by mouth daily.   clopidogrel  (PLAVIX ) 75 MG tablet TAKE 1 TABLET(75 MG) BY MOUTH DAILY (Patient taking differently: Take 75 mg by mouth daily.)   fenofibrate (TRICOR) 145 MG tablet Take 145 mg by mouth daily.   furosemide  (LASIX ) 20 MG tablet Take 1 tablet (20 mg total) by mouth every other day as needed for edema or fluid.   insulin glargine (LANTUS) 100 UNIT/ML injection Inject 20 Units into the skin at bedtime.   insulin lispro (HUMALOG) 100 UNIT/ML KwikPen Inject 5 Units into the skin 2 (two) times daily.   isosorbide  mononitrate (IMDUR ) 120 MG 24 hr tablet Take 1 tablet (120 mg total) by mouth daily.   isosorbide  mononitrate (IMDUR ) 60 MG 24 hr tablet Take 1 tablet (60 mg total) by mouth daily.   levothyroxine (SYNTHROID) 88 MCG tablet Take 88 mcg by mouth daily.   lisinopril  (ZESTRIL ) 40 MG tablet Take 1 tablet (40 mg total) by mouth daily.   omega-3 acid ethyl esters (LOVAZA) 1 g capsule Take 2 capsules by mouth 2 (two) times daily.   omeprazole (PRILOSEC) 20 MG capsule Take 20 mg by mouth 2 (two) times daily before a meal.     Allergies:   Patient has no known allergies.   Social History  Socioeconomic History   Marital status: Widowed    Spouse name: Not on file   Number of children: Not on file   Years of education: Not on file   Highest education level: Not on file  Occupational History   Not on file  Tobacco Use   Smoking status: Former    Types: Cigarettes   Smokeless tobacco: Never  Substance and Sexual Activity   Alcohol use: Not on file   Drug use: Not on file   Sexual activity: Not on file  Other Topics Concern   Not on file  Social History Narrative   Not on file   Social Drivers of Health   Financial Resource Strain: Not on file  Food Insecurity: Not on file  Transportation Needs: Not on file  Physical Activity: Not on file  Stress: Not on file   Social Connections: Not on file     Family History: The patient's family history includes Colon cancer in his mother. ROS:   Please see the history of present illness.    All 14 point review of systems negative except as described per history of present illness  EKGs/Labs/Other Studies Reviewed:    EKG Interpretation Date/Time:  Wednesday June 15 2024 08:16:45 EDT Ventricular Rate:  57 PR Interval:  176 QRS Duration:  110 QT Interval:  442 QTC Calculation: 430 R Axis:   -27  Text Interpretation: Sinus bradycardia with occasional Premature ventricular complexes Incomplete right bundle branch block Inferior infarct (cited on or before 18-Jun-2023) ST & T wave abnormality, consider lateral ischemia Abnormal ECG When compared with ECG of 18-Jun-2023 11:33, Premature ventricular complexes are now Present Incomplete right bundle branch block is now Present T wave inversion less evident in Lateral leads Confirmed by Bernie Charleston [phone]) on 06/15/2024 8:36:47 AM    Recent Labs: 11/20/2023: BUN 22; Creatinine, Ser 1.87; Potassium 4.1; Sodium 136  Recent Lipid Panel No results found for: CHOL, TRIG, HDL, CHOLHDL, VLDL, LDLCALC, LDLDIRECT  Physical Exam:    VS:  BP 126/70   Pulse (!) 57   Ht 5' 9 (1.753 m)   Wt 193 lb 12.8 oz (87.9 kg)   SpO2 98%   BMI 28.62 kg/m     Wt Readings from Last 3 Encounters:  06/15/24 193 lb 12.8 oz (87.9 kg)  05/04/24 192 lb 12.8 oz (87.5 kg)  04/12/24 193 lb (87.5 kg)     GEN:  Well nourished, well developed in no acute distress HEENT: Normal NECK: No JVD; No carotid bruits LYMPHATICS: No lymphadenopathy CARDIAC: RRR, no murmurs, no rubs, no gallops RESPIRATORY:  Clear to auscultation without rales, wheezing or rhonchi  ABDOMEN: Soft, non-tender, non-distended MUSCULOSKELETAL:  No edema; No deformity  SKIN: Warm and dry LOWER EXTREMITIES: no swelling NEUROLOGIC:  Alert and oriented x 3 PSYCHIATRIC:  Normal affect    ASSESSMENT:    1. Status post coronary artery bypass graft   2. Atherosclerosis of native coronary artery of native heart with stable angina pectoris (HCC)   3. Diabetes mellitus with stage 3 chronic kidney disease (HCC)   4. Dyspnea on exertion   5. Dyslipidemia    PLAN:    In order of problems listed above:  Coronary disease status post coronary bypass graft, angina pectoris stable pattern, will try ranolazine  500 mg twice daily and see if he feels any better with this setting. Diabetes followed by internal medicine team I did review K PN which show me his hemoglobin A1c of 5.7 from 04/25/2024 good  control. Dyslipidemia he is on statin total cholesterol 101 HDL 22 LDL 62 this is from early spring he will continue present management. Dyspnea on exertion encouraged him to be [place] more more active but not to the point of getting chest pain GERD some of the symptoms he describes suggest maybe that is what the problem is still Of the attack if ranolazine  will not help maybe trying some medication for stomach   Medication Adjustments/Labs and Tests Ordered: Current medicines are reviewed at length with the patient today.  Concerns regarding medicines are outlined above.  Orders Placed This Encounter  Procedures   EKG 12-Lead   Medication changes: No orders of the defined types were placed in this encounter.   Signed, Lamar DOROTHA Fitch, MD, [facility] 06/15/2024 8:52 AM    [facility]

## 2024-06-15 NOTE — Patient Instructions (Signed)
Medication Instructions:   START: Ranolazine 500mg 1 tablet twice daily   Lab Work: None Ordered If you have labs (blood work) drawn today and your tests are completely normal, you will receive your results only by: MyChart Message (if you have MyChart) OR A paper copy in the mail If you have any lab test that is abnormal or we need to change your treatment, we will call you to review the results.   Testing/Procedures: None Ordered   Follow-Up: At [facility], you and your health needs are our priority.  As part of our continuing mission to provide you with exceptional heart care, we have created designated Provider Care Teams.  These Care Teams include your primary Cardiologist (physician) and Advanced Practice Providers (APPs -  Physician Assistants and Nurse Practitioners) who all work together to provide you with the care you need, when you need it.  We recommend signing up for the patient portal called "MyChart".  Sign up information is provided on this After Visit Summary.  MyChart is used to connect with patients for Virtual Visits (Telemedicine).  Patients are able to view lab/test results, encounter notes, upcoming appointments, etc.  Non-urgent messages can be sent to your provider as well.   To learn more about what you can do with MyChart, go to [email or web address].    Your next appointment:   2 month(s)  The format for your next appointment:   In Person  Provider:   Robert Krasowski, MD    Other Instructions NA  

## 2024-07-25 DIAGNOSIS — L570 Actinic keratosis: Secondary | ICD-10-CM | POA: Diagnosis not present

## 2024-07-25 DIAGNOSIS — L821 Other seborrheic keratosis: Secondary | ICD-10-CM | POA: Diagnosis not present

## 2024-07-25 DIAGNOSIS — L578 Other skin changes due to chronic exposure to nonionizing radiation: Secondary | ICD-10-CM | POA: Diagnosis not present

## 2024-08-02 ENCOUNTER — Other Ambulatory Visit: Payer: Self-pay

## 2024-08-04 DIAGNOSIS — I2510 Atherosclerotic heart disease of native coronary artery without angina pectoris: Secondary | ICD-10-CM | POA: Diagnosis not present

## 2024-08-04 DIAGNOSIS — N1832 Chronic kidney disease, stage 3b: Secondary | ICD-10-CM | POA: Diagnosis not present

## 2024-08-04 DIAGNOSIS — I129 Hypertensive chronic kidney disease with stage 1 through stage 4 chronic kidney disease, or unspecified chronic kidney disease: Secondary | ICD-10-CM | POA: Diagnosis not present

## 2024-08-04 DIAGNOSIS — E1022 Type 1 diabetes mellitus with diabetic chronic kidney disease: Secondary | ICD-10-CM | POA: Diagnosis not present

## 2024-08-04 DIAGNOSIS — N189 Chronic kidney disease, unspecified: Secondary | ICD-10-CM | POA: Diagnosis not present

## 2024-08-04 DIAGNOSIS — D631 Anemia in chronic kidney disease: Secondary | ICD-10-CM | POA: Diagnosis not present

## 2024-08-11 ENCOUNTER — Other Ambulatory Visit: Payer: Self-pay | Admitting: Cardiology

## 2024-08-15 ENCOUNTER — Encounter: Payer: Self-pay | Admitting: Cardiology

## 2024-08-15 ENCOUNTER — Ambulatory Visit: Attending: Cardiology | Admitting: Cardiology

## 2024-08-15 VITALS — BP 110/52 | HR 51 | Ht 69.0 in | Wt 190.0 lb

## 2024-08-15 DIAGNOSIS — R0989 Other specified symptoms and signs involving the circulatory and respiratory systems: Secondary | ICD-10-CM

## 2024-08-15 DIAGNOSIS — I10 Essential (primary) hypertension: Secondary | ICD-10-CM | POA: Diagnosis not present

## 2024-08-15 DIAGNOSIS — Z951 Presence of aortocoronary bypass graft: Secondary | ICD-10-CM | POA: Diagnosis not present

## 2024-08-15 DIAGNOSIS — E785 Hyperlipidemia, unspecified: Secondary | ICD-10-CM

## 2024-08-15 NOTE — Progress Notes (Signed)
 Cardiology Office Note:    Date:  08/15/2024   ID:  Joshua Morrison, DOB 04-03-1941, MRN [id number]  PCP:  Street, Lonni HERO, MD  Cardiologist:  Lamar Fitch, MD    Referring MD: Street, Lonni HERO, *   Chief Complaint  Patient presents with   Follow-up  At some events 3 weeks ago  History of Present Illness:    Joshua Morrison is a 83 y.o. male past medical history significant for coronary artery disease status post coronary bypass graft 1995, essential hypertension, dyslipidemia, diabetes we have been struggling lately we are trying to manage she is angina pectoris.  Trying to put him on guideline directed medical therapy for angina with good response last time I put him on the ranolazine  and she is doing significantly better.  3 weeks ago he described episode that he went with his daughter to the beach after he became that he was not able to get out of the car because he was so weak both legs were weak but left leg was particularly weak then when he was there he describes situation that his leg was calm was getting up and he could not hold his body weight on his left leg.  Luckily things got much better and now he is practically back to normal.  There was no speech impairment there was no problem with left arm motion.  Interestingly he described that his urine did not look right and he said it was green.  He did see his nephrologist just recently urinalysis done no evidence of infection but he was told that that was probably urinary tract infection.  Since that time he is doing much better he said now he is ready to go back to the beach and enjoyed himself  Past Medical History:  Diagnosis Date   Acquired hypothyroidism    Atherosclerosis of native coronary artery of native heart with stable angina pectoris    Blood type B+    BMI 27.0-27.9,adult    Diabetes mellitus with stage 3 chronic kidney disease (HCC)    Dyslipidemia    Dyspnea on exertion    Essential hypertension  09/04/2022   Hypertensive heart disease with heart failure (HCC)    Overweight    Recurrent major depression in remission    Reflux gastritis    Senile osteopenia    Stage 3b chronic kidney disease (CKD) (HCC)    Status post coronary artery bypass graft 1995 [place] 09/04/2022   Vitamin D deficiency     Past Surgical History:  Procedure Laterality Date   CATARACT EXTRACTION, BILATERAL  01/2016   CHOLECYSTECTOMY  2014   CORONARY ARTERY BYPASS GRAFT  1995   TONSILLECTOMY      Current Medications: Current Meds  Medication Sig   amLODipine  (NORVASC ) 10 MG tablet Take 1 tablet (10 mg total) by mouth daily.   aspirin EC 81 MG tablet Take 81 mg by mouth daily.   atorvastatin  (LIPITOR) 80 MG tablet TAKE 1 TABLET(80 MG) BY MOUTH DAILY   Cholecalciferol (D3-1000 PO) Take 2,000 Units by mouth daily.   clopidogrel  (PLAVIX ) 75 MG tablet TAKE 1 TABLET(75 MG) BY MOUTH DAILY   fenofibrate (TRICOR) 145 MG tablet Take 145 mg by mouth daily.   furosemide  (LASIX ) 20 MG tablet Take 1 tablet (20 mg total) by mouth every other day as needed for edema or fluid.   insulin glargine (LANTUS) 100 UNIT/ML injection Inject 20 Units into the skin at bedtime.   insulin lispro (  HUMALOG) 100 UNIT/ML KwikPen Inject 5 Units into the skin 2 (two) times daily.   isosorbide  mononitrate (IMDUR ) 120 MG 24 hr tablet Take 1 tablet (120 mg total) by mouth daily.   isosorbide  mononitrate (IMDUR ) 60 MG 24 hr tablet Take 1 tablet (60 mg total) by mouth daily.   levothyroxine (SYNTHROID) 88 MCG tablet Take 88 mcg by mouth daily.   lisinopril  (ZESTRIL ) 40 MG tablet TAKE 1 TABLET(40 MG) BY MOUTH DAILY   omega-3 acid ethyl esters (LOVAZA) 1 g capsule Take 2 capsules by mouth 2 (two) times daily.   omeprazole (PRILOSEC) 20 MG capsule Take 20 mg by mouth 2 (two) times daily before a meal.   ranolazine  (RANEXA ) 500 MG 12 hr tablet Take 1 tablet (500 mg total) by mouth 2 (two) times daily.     Allergies:   Patient has no known  allergies.   Social History   Socioeconomic History   Marital status: Widowed    Spouse name: Not on file   Number of children: Not on file   Years of education: Not on file   Highest education level: Not on file  Occupational History   Not on file  Tobacco Use   Smoking status: Former    Types: Cigarettes   Smokeless tobacco: Never  Substance and Sexual Activity   Alcohol use: Not on file   Drug use: Not on file   Sexual activity: Not on file  Other Topics Concern   Not on file  Social History Narrative   Not on file   Social Drivers of Health   Financial Resource Strain: Not on file  Food Insecurity: Not on file  Transportation Needs: Not on file  Physical Activity: Not on file  Stress: Not on file  Social Connections: Not on file     Family History: The patient's family history includes Colon cancer in his mother. ROS:   Please see the history of present illness.    All 14 point review of systems negative except as described per history of present illness  EKGs/Labs/Other Studies Reviewed:         Recent Labs: 11/20/2023: BUN 22; Creatinine, Ser 1.87; Potassium 4.1; Sodium 136 12/08/2023: TSH 0.816  Recent Lipid Panel No results found for: CHOL, TRIG, HDL, CHOLHDL, VLDL, LDLCALC, LDLDIRECT  Physical Exam:    VS:  BP (!) 110/52   Pulse (!) 51   Ht 5' 9 (1.753 m)   Wt 190 lb (86.2 kg)   SpO2 99%   BMI 28.06 kg/m     Wt Readings from Last 3 Encounters:  08/15/24 190 lb (86.2 kg)  06/15/24 193 lb 12.8 oz (87.9 kg)  05/04/24 192 lb 12.8 oz (87.5 kg)     GEN:  Well nourished, well developed in no acute distress HEENT: Normal NECK: No JVD; No carotid bruits LYMPHATICS: No lymphadenopathy CARDIAC: RRR, no murmurs, no rubs, no gallops RESPIRATORY:  Clear to auscultation without rales, wheezing or rhonchi  ABDOMEN: Soft, non-tender, non-distended MUSCULOSKELETAL:  No edema; No deformity  SKIN: Warm and dry LOWER EXTREMITIES: no  swelling NEUROLOGIC:  Alert and oriented x 3 PSYCHIATRIC:  Normal affect   ASSESSMENT:    1. Essential hypertension   2. Status post coronary artery bypass graft 1995 [place]   3. Dyslipidemia    PLAN:    In order of problems listed above:  Status post coronary bypass graft, angina pectoris stable pattern, looks like under control right now we will continue present management.  Episode that he described to me with weakness of the left lower extremities.  I will do a CAT scan of his head to make sure there is no CVA.  In the meantime we will continue present management which include Plavix . Dyslipidemia I will continue with Lovaza and Lipitor 80 I did review KPN which show me total cholesterol 101 HDL 22. Essential hypertension blood pressure well-controlled   Medication Adjustments/Labs and Tests Ordered: Current medicines are reviewed at length with the patient today.  Concerns regarding medicines are outlined above.  No orders of the defined types were placed in this encounter.  Medication changes: No orders of the defined types were placed in this encounter.   Signed, Lamar DOROTHA Fitch, MD, [facility] 08/15/2024 9:01 AM    [facility]

## 2024-08-15 NOTE — Patient Instructions (Addendum)
 Medication Instructions:  Your physician recommends that you continue on your current medications as directed. Please refer to the Current Medication list given to you today.  *If you need a refill on your cardiac medications before your next appointment, please call your pharmacy*   Lab Work: None Ordered If you have labs (blood work) drawn today and your tests are completely normal, you will receive your results only by: MyChart Message (if you have MyChart) OR A paper copy in the mail If you have any lab test that is abnormal or we need to change your treatment, we will call you to review the results.   Testing/Procedures: Head CT- Non-Cardiac CT scanning, (CAT scanning), is a noninvasive, special x-ray that produces cross-sectional images of the body using x-rays and a computer. CT scans help physicians diagnose and treat medical conditions. For some CT exams, a contrast material is used to enhance visibility in the area of the body being studied. CT scans provide greater clarity and reveal more details than regular x-ray exams.  [facility] [address] [place], [place] [address] [phone]    Follow-Up: At [facility], you and your health needs are our priority.  As part of our continuing mission to provide you with exceptional heart care, we have created designated Provider Care Teams.  These Care Teams include your primary Cardiologist (physician) and Advanced Practice Providers (APPs -  Physician Assistants and Nurse Practitioners) who all work together to provide you with the care you need, when you need it.  We recommend signing up for the patient portal called MyChart.  Sign up information is provided on this After Visit Summary.  MyChart is used to connect with patients for Virtual Visits (Telemedicine).  Patients are able to view lab/test results, encounter notes, upcoming appointments, etc.  Non-urgent messages can be sent to your provider as well.   To learn  more about what you can do with MyChart, go to [email or web address].    Your next appointment:   3 month(s)  The format for your next appointment:   In Person  Provider:   Lamar Fitch, MD    Other Instructions NA

## 2024-08-15 NOTE — Addendum Note (Signed)
 Addended by: ARLOA PLANAS D on: 08/15/2024 09:16 AM   Modules accepted: Orders

## 2024-08-16 DIAGNOSIS — H40013 Open angle with borderline findings, low risk, bilateral: Secondary | ICD-10-CM | POA: Diagnosis not present

## 2024-08-16 DIAGNOSIS — H52223 Regular astigmatism, bilateral: Secondary | ICD-10-CM | POA: Diagnosis not present

## 2024-08-16 DIAGNOSIS — H524 Presbyopia: Secondary | ICD-10-CM | POA: Diagnosis not present

## 2024-08-16 DIAGNOSIS — Z7984 Long term (current) use of oral hypoglycemic drugs: Secondary | ICD-10-CM | POA: Diagnosis not present

## 2024-08-16 DIAGNOSIS — E119 Type 2 diabetes mellitus without complications: Secondary | ICD-10-CM | POA: Diagnosis not present

## 2024-08-16 DIAGNOSIS — H40003 Preglaucoma, unspecified, bilateral: Secondary | ICD-10-CM | POA: Diagnosis not present

## 2024-08-16 DIAGNOSIS — H47233 Glaucomatous optic atrophy, bilateral: Secondary | ICD-10-CM | POA: Diagnosis not present

## 2024-08-16 DIAGNOSIS — Z794 Long term (current) use of insulin: Secondary | ICD-10-CM | POA: Diagnosis not present

## 2024-08-16 DIAGNOSIS — H5231 Anisometropia: Secondary | ICD-10-CM | POA: Diagnosis not present

## 2024-08-17 ENCOUNTER — Encounter: Payer: Self-pay | Admitting: Cardiology

## 2024-08-19 ENCOUNTER — Ambulatory Visit (HOSPITAL_BASED_OUTPATIENT_CLINIC_OR_DEPARTMENT_OTHER)
Admission: RE | Admit: 2024-08-19 | Discharge: 2024-08-19 | Disposition: A | Discharge status: Home / Self Care | Source: Ambulatory Visit | Attending: Cardiology | Admitting: Cardiology

## 2024-08-19 DIAGNOSIS — R0989 Other specified symptoms and signs involving the circulatory and respiratory systems: Secondary | ICD-10-CM | POA: Diagnosis not present

## 2024-08-19 DIAGNOSIS — I6782 Cerebral ischemia: Secondary | ICD-10-CM | POA: Diagnosis not present

## 2024-08-19 DIAGNOSIS — R29818 Other symptoms and signs involving the nervous system: Secondary | ICD-10-CM | POA: Diagnosis not present

## 2024-08-25 ENCOUNTER — Ambulatory Visit: Payer: Self-pay | Admitting: Cardiology

## 2024-08-29 DIAGNOSIS — N1832 Chronic kidney disease, stage 3b: Secondary | ICD-10-CM | POA: Diagnosis not present

## 2024-09-08 ENCOUNTER — Telehealth: Payer: Self-pay

## 2024-09-08 NOTE — Telephone Encounter (Signed)
 Pt viewed CT head results on My Chart per Dr. Karry note. Routed to PCP.

## 2024-09-15 ENCOUNTER — Other Ambulatory Visit: Payer: Self-pay | Admitting: Cardiology

## 2024-09-19 ENCOUNTER — Telehealth: Payer: Self-pay | Admitting: *Deleted

## 2024-09-19 MED ORDER — ISOSORBIDE MONONITRATE ER 120 MG PO TB24: 120.0000 mg | ORAL_TABLET | Freq: Every day | ORAL | 3 refills | Status: AC

## 2024-09-19 NOTE — Telephone Encounter (Signed)
 Rx refill sent to pharmacy.

## 2024-10-07 ENCOUNTER — Telehealth: Payer: Self-pay

## 2024-10-07 DIAGNOSIS — E785 Hyperlipidemia, unspecified: Secondary | ICD-10-CM

## 2024-10-07 NOTE — Telephone Encounter (Signed)
 Referral to Pharm D for Repatha per Dr. Karry note

## 2024-11-15 ENCOUNTER — Ambulatory Visit: Admitting: Cardiology

## 2024-11-30 ENCOUNTER — Ambulatory Visit: Admitting: Pharmacist Clinician (PhC)/ Clinical Pharmacy Specialist
